# Patient Record
Sex: Female | Born: 1955 | Race: Black or African American | Hispanic: No | Marital: Single | Smoking: Never smoker
Health system: Southern US, Community
[De-identification: ages and names within clinical notes are randomized; demographics above are authoritative.]

## PROBLEM LIST (undated history)

## (undated) DIAGNOSIS — I10 Essential (primary) hypertension: Secondary | ICD-10-CM

## (undated) DIAGNOSIS — Z9889 Other specified postprocedural states: Secondary | ICD-10-CM

## (undated) DIAGNOSIS — T7840XA Allergy, unspecified, initial encounter: Secondary | ICD-10-CM

## (undated) DIAGNOSIS — E119 Type 2 diabetes mellitus without complications: Secondary | ICD-10-CM

## (undated) DIAGNOSIS — R112 Nausea with vomiting, unspecified: Secondary | ICD-10-CM

## (undated) HISTORY — DX: Nausea with vomiting, unspecified: Z98.890

## (undated) HISTORY — DX: Allergy, unspecified, initial encounter: T78.40XA

## (undated) HISTORY — PX: BREAST EXCISIONAL BIOPSY: SUR124

## (undated) HISTORY — PX: ROTATOR CUFF REPAIR: SHX139

## (undated) HISTORY — DX: Nausea with vomiting, unspecified: R11.2

## (undated) HISTORY — PX: ABDOMINAL HYSTERECTOMY: SHX81

## (undated) HISTORY — PX: OTHER SURGICAL HISTORY: SHX169

## (undated) HISTORY — DX: Other specified postprocedural states: Z98.890

## (undated) HISTORY — PX: ECTOPIC PREGNANCY SURGERY: SHX613

---

## 1997-12-16 ENCOUNTER — Emergency Department (HOSPITAL_COMMUNITY): Admission: EM | Admit: 1997-12-16 | Discharge: 1997-12-16 | Payer: Self-pay | Admitting: Emergency Medicine

## 1998-03-08 HISTORY — PX: COLONOSCOPY: SHX174

## 1998-05-09 ENCOUNTER — Other Ambulatory Visit: Admission: RE | Admit: 1998-05-09 | Discharge: 1998-05-09 | Payer: Self-pay | Admitting: Obstetrics and Gynecology

## 2002-05-09 ENCOUNTER — Other Ambulatory Visit: Admission: RE | Admit: 2002-05-09 | Discharge: 2002-05-09 | Payer: Self-pay | Admitting: Gynecology

## 2002-06-20 ENCOUNTER — Encounter: Admission: RE | Admit: 2002-06-20 | Discharge: 2002-06-20 | Payer: Self-pay

## 2002-06-21 ENCOUNTER — Emergency Department (HOSPITAL_COMMUNITY): Admission: EM | Admit: 2002-06-21 | Discharge: 2002-06-21 | Payer: Self-pay | Admitting: Emergency Medicine

## 2002-07-11 ENCOUNTER — Encounter: Payer: Self-pay | Admitting: Specialist

## 2002-07-16 ENCOUNTER — Encounter: Payer: Self-pay | Admitting: Specialist

## 2002-07-17 ENCOUNTER — Inpatient Hospital Stay (HOSPITAL_COMMUNITY): Admission: AD | Admit: 2002-07-17 | Discharge: 2002-07-18 | Payer: Self-pay | Admitting: Specialist

## 2003-07-02 ENCOUNTER — Ambulatory Visit (HOSPITAL_BASED_OUTPATIENT_CLINIC_OR_DEPARTMENT_OTHER): Admission: RE | Admit: 2003-07-02 | Discharge: 2003-07-02 | Payer: Self-pay | Admitting: Orthopedic Surgery

## 2003-11-26 ENCOUNTER — Encounter: Admission: RE | Admit: 2003-11-26 | Discharge: 2003-11-26 | Payer: Self-pay | Admitting: Specialist

## 2005-03-23 ENCOUNTER — Other Ambulatory Visit: Admission: RE | Admit: 2005-03-23 | Discharge: 2005-03-23 | Payer: Self-pay | Admitting: Obstetrics and Gynecology

## 2008-10-09 ENCOUNTER — Emergency Department (HOSPITAL_COMMUNITY): Admission: EM | Admit: 2008-10-09 | Discharge: 2008-10-09 | Payer: Self-pay | Admitting: Emergency Medicine

## 2008-10-13 ENCOUNTER — Emergency Department (HOSPITAL_COMMUNITY): Admission: EM | Admit: 2008-10-13 | Discharge: 2008-10-13 | Payer: Self-pay | Admitting: Emergency Medicine

## 2010-03-28 ENCOUNTER — Encounter: Payer: Self-pay | Admitting: Internal Medicine

## 2010-07-24 NOTE — Op Note (Signed)
NAME:  PANSIE, GUGGISBERG                       ACCOUNT NO.:  [id number]   MEDICAL RECORD NO.:  [id number]                   PATIENT TYPE:  INP   LOCATION:  5008                                 FACILITY:  [facility]   PHYSICIAN:  Kerrin Champagne, M.D.                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   POSTOPERATIVE DIAGNOSES:  1. 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  2. Right-sided spondylosis changes, C6-7.   PROCEDURES:  1. Anterior cervical diskectomy and fusion, C5-6, C6-7, utilizing the     operating room microscope.  2. Right iliac crest bone graft harvest for purposes of fusion.  3. Internal fixation, C5-6 and C6-7, using a 41 mm DePuy locking plate with     14 mm screws.  A single revision screw was used on the right side at the     C7 level.   SURGEON:  Kerrin Champagne, M.D.   ASSISTANT:  Wende Neighbors, P.A.   ANESTHESIA:  GOT, Bedelia Person, M.D.   ESTIMATED BLOOD LOSS:  150 mL.   DRAINS:  TLS drain left neck, Foley to straight drain.   BRIEF CLINICAL HISTORY:  The patient is a 55 year old female with a history  of neck pain over a four-week period, severe pain with radiation to the  right arm in a C6-C7 distribution.  The patient has undergone attempts at  conservative management, including the use of steroid medications, anti-  inflammatories, muscle relaxants, and narcotics.  She is having severe night  pain and difficulty with sleeping, radiation to the right arm, positive  abduction sign, pain only improved with adduction of the right arm away from  her side.  Clinically shows signs of weakness in a C7 distribution and C6  distribution.  MRI study consistent with a large disk protrusion in the  right-sided C5-6  affecting the right-sided thecal sac, causing cord shift,  and affecting the C6 and C7 nerve roots.  A small amount of disk protrusion  noted at C6-7 on the right side with compression of the C7 nerve root to a  mild degree.   INTRAOPERATIVE FINDINGS:  Extruded disk material, right side, causing C6, C7  nerve root compression with compression of the right-sided thecal sac at the  C6-7 level.  Right C5-6 spondylosis change involving the uncovertebral  joint.   DESCRIPTION OF PROCEDURE:  After adequate general anesthesia, patient in  beach chair position, Foley catheter placed, bump on the right buttock, neck  in slight extension with transverse roll at the level of the shoulders.  Five pounds of cervical Holter traction with use of the [place] horseshoe.  Standard  preoperative antibiotics, standard prep with Duraprep solution of  the anterior left neck, right iliac crest, skin over the right lower abdomen  placed under some tension using tape in order to be able to expose the right  iliac crest.  Draped in the usual manner, iodine Vi-Drape over the right  iliac crest and left neck.  The incision, left neck, in line with skin  creases, at the expected C6 level through the skin and subcutaneous layers  at midline, incision approximately 4 cm in length, carried down to the level  of the platysma using a 10 blade scalpel after infiltration with Marcaine  0.5% with 1:200,000 epinephrine.  The platysmal layer then incised in line  with the skin incision and then the subcutaneous layers then spread.  Small  veins were encountered.  These were cauterized as well as suture ligated  over the anterior aspect of the cervical region in the layer just deep to  the platysmal layer.  These were well-controlled.  Blunt dissection then  used to dissect between the planes of fascia between the trachea and  esophagus medially and the carotid sheath laterally to the anterior aspect  of the cervical spine and  prevertebral fascial layer.  Hand-held Clowards  then used to retract trachea and esophagus and the prevertebral fascia then  cauterized along the medial border of the longus colli muscle and then  teased across the midline using a Barista.  A spinal needle with  the sheaths intact, allowing only a centimeter of the needle to protrude was  then inserted at the expected C5-6, C6-7 levels.  Intraoperative lateral  radiograph was not performed; in fact, C-arm fluoroscopy was used to  ascertain the correct position and alignment of these pins, found to be  present at both C5-6 and C6-7.  Permanent C-arm images used for permanent  documentation here.  The medial border of the longus colli muscle then  developed using Key elevator as well as electrocautery.  The Caspar self-  retaining retractors were then inserted with foot of the retractor beneath  the border of the longus colli muscle bilaterally, exposing first at C6-7  level.  Note that prior to the insertion of this self-retaining retractor,  the spinal needles were removed and a small portion of the anterior annulus  was excised using a 15 blade scalpel with hand-held retraction to protect  the soft tissue structures medial and lateral.  With the Caspar retractors  then placed, first at the C6-7 level screw posts were then inserted into the  anterior aspects of the vertebral bodies at both C6 and C7 following  debridement of soft tissue along the anterior aspect of the C7 and the C6  vertebral bodies.  Osteophytes at the C6-7 level disk space were removed  using high-speed bur and Beyer rongeur.  The main portion of the annulus  anteriorly resected using a 15 blade scalpel under loupe magnification and  then curettage performed of the anterior two-thirds of the disk as well as  pituitary rongeurs used to remove the disk.  The operating room microscope  then draped, brought into the field.  Under direct visualization the posterior  disk excised at the C6-7 level.  Posterior lip osteophytes excised  over the posterior superior aspect of C7.  An uncovertebral foraminotomy  performed over the right side, excising the vertebral spurs, decompressing  the right C7 nerve root.  The central portion of the disk was also excised  posteriorly and the disk excised  back to the posterior longitudinal  ligament.  The height of the intervertebral disk space was measured using a  sounder and sounded out at 7 mm, the depth measuring approximately 16 mm.  A  depth of 14 mm for the graft and a height of 7 mm was chosen.  An incision  made over the right iliac crest approximately two inches back from the  anterior superior iliac spine, incision through skin and subcutaneous  layers.  A small sensory branch of the lateral cutaneous branch of the  femoral nerve was identified and this was preserved, protected throughout  the case.  The self-retaining retractors were inserted. Incision carried  down to the iliac crest and then Cobbs used to elevate the periosteum off of  the superior medial aspect of the iliac crest as well as laterally.  Hand-  held Clowards then placed, and the dual oscillating saw used to incise the  crest to a level of about 15-16 mm.  A quarter-inch curved osteotome used to  cut the base of this tricortical iliac crest bone graft piece.  The piece  was then carefully made with dimensions of the intervertebral disk space,  depth of 14 mm, a height of 7 mm.  The graft was easily placed within the  intervertebral disk space, and it was keyed anteriorly.  Traction then  released over the screw posts and reinserted at the C6-7 level using the  distraction system provided.  Bone wax was applied to the bleeding screw  post holes, that following removal of the screw posts.  The self-retaining  retractor, Caspar retractor, was then removed and reinserted at the C5-6  level.  Care taken to protect the esophagus and trachea  throughout this  procedure.  Exposure obtained at the C5-6 level, similarly the C6-7.  Osteophytes over the anterior aspect of C5-6 were resected.  Soft tissue  over the anterior lower half of the C5 and upper part of C6 was cauterized  down to bone.  A high-speed bur used to carefully smooth the anterior  osteophyte present and a 15 blade scalpel used to incise further the annular  material on the anterior aspect of the disk at C5-6.  Screw posts inserted  at the C5 level parallel to that of 6 and distraction obtained.  Disk  curetted and the end plates curetted of the cartilaginous material at C5-6,  and this was similarly done at C6-7 previous.  With curettage in the disk  space performed down to subchondral bone, the posterior aspect of the disk  was excised back to posterior longitudinal ligament.  Posterior longitudinal  ligament was then resected along the right side using a 1 mm Kerrison as  well as a titanium nerve hook.  Beneath the posterior longitudinal ligament was found to be a very large disk protrusion compressing the right-sided  thecal sac and the right C6 and C7 nerve roots.  This was resected without  difficulty using titanium nerve hook and micropituitary.  A 1 mm Kerrison  then used to perform a foraminotomy on the right side and then a 2 mm  Kerrison.  The C6 nerve root showed normal appearance, some mild swelling  and erythema associated with the area of compression due to the disk  rupture, but overall the thecal sac and nerve roots appeared to be intact  throughout.  Irrigation was then performed, curettage performed of the end  plates.  The height of the intervertebral disk space measured at 7 mm using  a sounder, depth of 16 mm again.  Additional iliac crest was then harvested  from the right iliac crest using the dual oscillating saw, 7 mm width.  An  additional graft obtained, tapered to the dimensions of the intervertebral  disk space, height of 7 mm, depth  13-14 mm.  This graft then inserted,  impacted into place.  The screw posts then removed from the C5 and C6 level  and bone wax applied to the bleeding screw post holes.  Length for the  expected plate was then measured using a cottonoid string coated with bone  wax.  Applied to the central portion of the body of C5 to C7, measuring  approximately 35 mm in length, which equalled that of a 41 mm plate between  the upper and lower holes.  The plate was pre-contoured.  The anterior  aspect of the cervical spine carefully debrided of any osteophytes to allow  for the plate to be able to anneal against for good fixation purposes.  The  plate then placed against the anterior aspect of the cervical spine in the  midline and slightly tapered on the left side and then pinned into place  with pins at the C5 level and C7 level.  Cervical traction was released.  Screws were then first placed at the C6 level, at very central levels by  performing drill holes for 14 mm screws and then placing the 14 mm screws  appropriately.  The pin then removed at the C5 level superiorly and then  drill holes performed both right and left.  These were then replaced with  the drill with the 14 mm screws at this level.  The pin was then removed at  the C7 level and then drill holes placed and 14 mm screws placed.  A  revision screw was necessary on the right side of C7, as adequate purchase  was not provided with the usual screw at the C7 right side.  This probably  is secondary the hole for the previous screw post.  Following this,  irrigation was performed and no active bleeding was felt to be evidenced.  The esophagus was examined and demonstrated no abnormalities.  The locks for  the locking screws were then carefully turned 180-270 degrees each, locking  the screws to the plate.  Following irrigation then, a 10 mm drain was  placed in the depths of the incision over the left side of the plate.  No  active bleeding  evident associated with placement of that drain.  The platysmal layer was then reapproximated with interrupted 3-0 Vicryl suture  and the deep subcu layers with interrupted 3-0 Vicryl suture and the skin  approximated with a running subcu stitch of 4-0 Vicryl.  Tincture of Benzoin  and Steri-Strips applied.  The 10 mm drain sewn in place with 4-0 nylon  suture.  The right iliac crest bone graft harvest site carefully coated with  bone wax for hemostasis of the cancellous bone surfaces, Gelfoam applied.  Irrigation performed.  Additional Gelfoam placed.  No active bleeding  evident.  The fascial layers then approximated over the iliac crest using #1  Vicryl sutures.  Deep subcu layers approximated with interrupted #1 and 0  Vicryl sutures, more superficial layers with interrupted 2-0 Vicryl suture,  and the skin closed with a running subcu stitch of 4-0 Vicryl.  Tincture of  Benzoin and Steri-Strips applied, 4 x 4's affixed to the skin with Hypafix  tape over the iliac crest.  A single 4 x 4 placed over the anterior neck and  the Hypafix tape used to affix it in place.  A Philadelphia collar was  applied.  Intraoperative lateral radiograph obtained prior to closure of the  wounds using the C-arm fluoroscopy demonstrated plates and screws in good  position and alignment, back in good position and alignment, without  evidence of retropulsion.  With application of the Philadelphia collar, all  instrument and sponge counts were correct.  The patient was reactivated,  extubated, and returned to the recovery room in satisfactory condition.                                                Kerrin Champagne, M.D.    JEN/MEDQ  D:  07/17/2002  T:  07/19/2002  Job:  [id number]

## 2010-07-24 NOTE — Op Note (Signed)
NAME:  Tamara Klein, Tamara Klein NO.:  [id number]   MEDICAL RECORD NO.:  [id number]                   PATIENT TYPE:  AMB   LOCATION:  DSC                                  FACILITY:  [facility]   PHYSICIAN:  Leonides Grills, M.D.                  DATE OF BIRTH:  10-Apr-1955   DATE OF PROCEDURE:  07/02/2003  DATE OF DISCHARGE:                                 OPERATIVE REPORT   ADDENDUM:  Add the following to the dictation:   Right Achilles tendon rupture in preoperative diagnosis.   Right primary Achilles tendon repair in procedure performed.                                               Leonides Grills, M.D.    PB/MEDQ  D:  07/02/2003  T:  07/02/2003  Job:  [id number]

## 2010-07-24 NOTE — Op Note (Signed)
NAME:  Tamara Klein, Tamara Klein                         ACCOUNT NO.:  [id number]   MEDICAL RECORD NO.:  [id number]                   PATIENT TYPE:  AMB   LOCATION:  DSC                                  FACILITY:  [facility]   PHYSICIAN:  Leonides Grills, M.D.                  DATE OF BIRTH:  [birth year]   DATE OF PROCEDURE:  07/02/2003  DATE OF DISCHARGE:                                 OPERATIVE REPORT   PREOPERATIVE DIAGNOSIS:  1. Right Hanglund's deformity.  2. Right calcific Achilles tendonitis.  3. Right tight gastrocnemius.   POSTOPERATIVE DIAGNOSIS:  1. Right Hanglund's deformity.  2. Right calcific Achilles tendonitis.  3. Right tight gastrocnemius.   OPERATION:  1. Right Hanglund's deformity excision.  2. Right excision calcification Achilles tendon.  3. Right gastrocnemius release.  4. Stress x-rays right ankle.   ANESTHESIA:  General endotracheal tube with popliteal block.   SURGEON:  Leonides Grills, M.D.   ASSISTANT:  Lianne Cure, P.A.-C.   ESTIMATED BLOOD LOSS:  Minimal.   TOURNIQUET TIME:  Approximately one hour.   COMPLICATIONS:  None.   DISPOSITION:  Stable to the PR.   INDICATIONS FOR PROCEDURE:  This is a 55 year old female with persistent  long-standing posterior heel pain interfering with her life.  She was  consented for the above procedure.  All risks which included infection,  neurovascular injury, Achilles tendon rupture, persistent pain, worsening  pain, recalcification of the Achilles tendon were all explained, questions  were encouraged and answered.   OPERATION:  The patient was brought to the operating room and placed in  supine position, initially, after adequate general endotracheal anesthesia  was administered as well as Ancef 1 gram IV piggyback.  I then placed her in  a prone position, all bony prominences and chest were well padded.  The  right lower extremity was then prepped and draped in a sterile manner over a  proximally placed thigh  tourniquet.  We started the procedure with a  longitudinal incision over the medial gastrocnemius muscle tendinous  junction.  Dissection was carried down through the skin, hemostasis was  obtained.  The fascia was opened in line with the incision.  The conjoined  region was then developed between the gastroc and soleus muscles.  The soft  tissue was then elevated off the posterior aspect of the gastrocnemius.  The  gastrocnemius was then released with the Mayo scissors.  This had an  excellent release of the tight gastroc.  The wound was copiously irrigated  with normal saline.  The subcu was closed with 3-0 Vicryl and the skin was  closed with 4-0 Monocryl subcuticular stitch.  Steri-Strips were applied.  We then gravity exsanguinated the right lower extremity and the tourniquet  was elevated to 290 mmHg.  A longitudinal incision on either side of the  Achilles tendon was made, dissection was carried down to bone.  Soft tissue  was  elevated off the calcaneal tuber respectively.  Under C-arm guidance,  stress x-rays were able to ascertain exactly where the calcification was and  what level we needed to osteotomize.  We then performed the Haglund's  excision with the sagittal saw.  Once this was done, we then removed the  calcification with the Achilles tendon with a 15 blade scalpel and also, at  times, with the saw.  The Achilles tendon was elevated directly off the  bone.  Once this was done, we then, because the Achilles tendon was  diseased, repaired it back down to the exposed calcaneal tuber using 5.0  corkscrew suture anchors that were absorbable with #2 FiberWire.  Once this  was repaired down, we did the remaining repair on either side with 2-0  FiberWire.  We then discontinued the tourniquet, hemostasis was obtained.  The subcu was closed with 3-0 Vicryl, skin was closed with 4-0 nylon.  Sterile dressing was applied.  A modified Jones dressing was applied with  the ankle in  gravity equinus.  The patient was stable to the PR.                                               Leonides Grills, M.D.    PB/MEDQ  D:  07/02/2003  T:  07/02/2003  Job:  [id number]

## 2010-07-27 ENCOUNTER — Other Ambulatory Visit (HOSPITAL_COMMUNITY): Payer: Self-pay | Admitting: Internal Medicine

## 2010-07-27 DIAGNOSIS — Z1231 Encounter for screening mammogram for malignant neoplasm of breast: Secondary | ICD-10-CM

## 2010-08-05 ENCOUNTER — Ambulatory Visit (HOSPITAL_COMMUNITY)
Admission: RE | Admit: 2010-08-05 | Discharge: 2010-08-05 | Disposition: A | Discharge status: Home / Self Care | Payer: Self-pay | Source: Ambulatory Visit | Attending: Internal Medicine | Admitting: Internal Medicine

## 2010-08-05 DIAGNOSIS — Z1231 Encounter for screening mammogram for malignant neoplasm of breast: Secondary | ICD-10-CM

## 2011-06-23 ENCOUNTER — Encounter (HOSPITAL_COMMUNITY): Payer: Self-pay | Admitting: Emergency Medicine

## 2011-06-23 ENCOUNTER — Emergency Department (HOSPITAL_COMMUNITY): Payer: Self-pay

## 2011-06-23 ENCOUNTER — Emergency Department (HOSPITAL_COMMUNITY)
Admission: EM | Admit: 2011-06-23 | Discharge: 2011-06-23 | Disposition: A | Discharge status: Home / Self Care | Payer: Self-pay | Attending: Emergency Medicine | Admitting: Emergency Medicine

## 2011-06-23 DIAGNOSIS — M25511 Pain in right shoulder: Secondary | ICD-10-CM

## 2011-06-23 DIAGNOSIS — M542 Cervicalgia: Secondary | ICD-10-CM | POA: Insufficient documentation

## 2011-06-23 DIAGNOSIS — R209 Unspecified disturbances of skin sensation: Secondary | ICD-10-CM | POA: Insufficient documentation

## 2011-06-23 DIAGNOSIS — Z9889 Other specified postprocedural states: Secondary | ICD-10-CM | POA: Insufficient documentation

## 2011-06-23 DIAGNOSIS — M25519 Pain in unspecified shoulder: Secondary | ICD-10-CM | POA: Insufficient documentation

## 2011-06-23 MED ORDER — METHOCARBAMOL 500 MG PO TABS: 1000.0000 mg | ORAL_TABLET | Freq: Three times a day (TID) | ORAL | Status: AC

## 2011-06-23 NOTE — ED Notes (Signed)
Pt states she has had numbness in her hands for a while but within the past week she has developed pain in her neck and tingling in her right arm  Pt states when the tingling starts the arm feels like it goes numb at the shoulder area  Pt states the pain wakes her up at night

## 2011-06-23 NOTE — Discharge Instructions (Signed)
Your xray showed some degenerative (arthritis) changes to your neck but did not show any acute, worrisome findings. This may represent a muscle spasm. Please follow up with [facility] for further evaluation and treatment - call to make an appointment this week. Return to the ER if you develop increased weakness, numbness, inability to move the arm, or any other worrisome symptoms.  Arthralgia Your caregiver has diagnosed you as suffering from an arthralgia. Arthralgia means there is pain in a joint. This can come from many reasons including:  Bruising the joint which causes soreness (inflammation) in the joint.   Wear and tear on the joints which occur as we grow older (osteoarthritis).   Overusing the joint.   Various forms of arthritis.   Infections of the joint.  Regardless of the cause of pain in your joint, most of these different pains respond to anti-inflammatory drugs and rest. The exception to this is when a joint is infected, and these cases are treated with antibiotics, if it is a bacterial infection. HOME CARE INSTRUCTIONS   Rest the injured area for as long as directed by your caregiver. Then slowly start using the joint as directed by your caregiver and as the pain allows. Crutches as directed may be useful if the ankles, knees or hips are involved. If the knee was splinted or casted, continue use and care as directed. If an stretchy or elastic wrapping bandage has been applied today, it should be removed and re-applied every 3 to 4 hours. It should not be applied tightly, but firmly enough to keep swelling down. Watch toes and feet for swelling, bluish discoloration, coldness, numbness or excessive pain. If any of these problems (symptoms) occur, remove the ace bandage and re-apply more loosely. If these symptoms persist, contact your caregiver or return to this location.   For the first 24 hours, keep the injured extremity elevated on pillows while lying down.   Apply ice  for 15 to 20 minutes to the sore joint every couple hours while awake for the first half day. Then 3 to 4 times per day for the first 48 hours. Put the ice in a plastic bag and place a towel between the bag of ice and your skin.   Wear any splinting, casting, elastic bandage applications, or slings as instructed.   Only take over-the-counter or prescription medicines for pain, discomfort, or fever as directed by your caregiver. Do not use aspirin immediately after the injury unless instructed by your physician. Aspirin can cause increased bleeding and bruising of the tissues.   If you were given crutches, continue to use them as instructed and do not resume weight bearing on the sore joint until instructed.  Persistent pain and inability to use the sore joint as directed for more than 2 to 3 days are warning signs indicating that you should see a caregiver for a follow-up visit as soon as possible. Initially, a hairline fracture (break in bone) may not be evident on X-rays. Persistent pain and swelling indicate that further evaluation, non-weight bearing or use of the joint (use of crutches or slings as instructed), or further X-rays are indicated. X-rays may sometimes not show a small fracture until a week or 10 days later. Make a follow-up appointment with your own caregiver or one to whom we have referred you. A radiologist (specialist in reading X-rays) may read your X-rays. Make sure you know how you are to obtain your X-ray results. Do not assume everything is normal if  you do not hear from [place]. SEEK MEDICAL CARE IF: Bruising, swelling, or pain increases. SEEK IMMEDIATE MEDICAL CARE IF:   Your fingers or toes are numb or blue.   The pain is not responding to medications and continues to stay the same or get worse.   The pain in your joint becomes severe.   You develop a fever over 102 F (38.9 C).   It becomes impossible to move or use the joint.  MAKE SURE YOU:   Understand these  instructions.   Will watch your condition.   Will get help right away if you are not doing well or get worse.  Document Released: 02/22/2005 Document Revised: 02/11/2011 Document Reviewed: 10/11/2007 [facility] Patient Information 2012 [place], [place].

## 2011-06-23 NOTE — ED Provider Notes (Signed)
History     CSN: [id number]  Arrival date & time 06/23/11  2001   First MD Initiated Contact with Patient 06/23/11 2038      Chief Complaint  Patient presents with  . Neck Pain    (Consider location/radiation/quality/duration/timing/severity/associated sxs/prior treatment) HPI Hx from pt. 56yo F presents with tingling to her right arm. She has had intermittent numbness to her bilateral hands but developed pain in her R shoulder and some tingling going into her upper arm over the past week. She has been seen for the numbness at her PCP at [facility] several months ago and was prescribed naproxen. Pt states the numbness extends to all of her fingers. Denies decreased grip strength, difficulty moving extremities. No known exacerbating factors. The pain is not worse at a certain time of the day. Of note, she does have a hx of cervical spine fusion, which was performed over 10 years ago by a Writer. She additionally has a history of rotator cuff repair to the right shoulder approximately 5 years ago. No trauma. No change in activity.  History reviewed. No pertinent past medical history.  Past Surgical History  Procedure Date  . Breast lumpectomy   . Ectopic pregnancy surgery   . Abdominal hysterectomy   . Left arthrocsopic knee surgery   . Left bunionectomy   . Bone spur removal bilateral heels   . Rotator cuff repair     Family History  Problem Relation Age of Onset  . Cancer Mother   . Hypertension Other   . Coronary artery disease Other   . Diabetes Other     History  Substance Use Topics  . Smoking status: Never Smoker   . Smokeless tobacco: Not on file  . Alcohol Use: Yes     rare    OB History    Grav Para Term Preterm Abortions TAB SAB Ect Mult Living                  Review of Systems  Constitutional: Negative.   HENT: Negative for neck pain and neck stiffness.   Eyes: Negative for visual disturbance.  Respiratory: Negative for shortness of breath.    Cardiovascular: Negative for chest pain and palpitations.  Gastrointestinal: Negative for nausea, vomiting and abdominal pain.  Musculoskeletal: Positive for myalgias.  Skin: Negative for color change and rash.  Neurological: Negative for dizziness, syncope, speech difficulty, weakness, light-headedness and headaches.    Allergies  Review of patient's allergies indicates no known allergies.  Home Medications   Current Outpatient Rx  Name Route Sig Dispense Refill  . NAPROXEN 500 MG PO TABS Oral Take 500 mg by mouth 2 (two) times daily with a meal.      BP 130/75  Pulse 83  Temp(Src) 97.5 F (36.4 C) (Oral)  Resp 20  Wt 212 lb (96.163 kg)  SpO2 100%  Physical Exam  Nursing note and vitals reviewed. Constitutional: She is oriented to person, place, and time. She appears well-developed and well-nourished. No distress.  HENT:  Head: Normocephalic and atraumatic.  Right Ear: External ear normal.  Left Ear: External ear normal.  Mouth/Throat: Oropharynx is clear and moist. No oropharyngeal exudate.  Neck: Normal range of motion. Neck supple.  Cardiovascular: Normal rate, regular rhythm and normal heart sounds.   Pulmonary/Chest: Effort normal and breath sounds normal.  Musculoskeletal:       Spine: No palpable stepoff, crepitus, or gross deformity appreciated. No midline tenderness. No appreciable spasm of paravertebral muscles.  Full range of motion in the neck.  Palpable muscle spasm to bilateral trapezii. Full range of motion in shoulders, elbows, and wrists. Negative Tinel's and Phalen's testing.  Lymphadenopathy:    She has no cervical adenopathy.  Neurological: She is alert and oriented to person, place, and time. No cranial nerve deficit. She exhibits normal muscle tone. Coordination normal.       Strength 5 out of 5 on testing of bilateral upper extremities at shoulders, elbows, wrists and fingers. Sensory is grossly intact to light touch in the radial, median, and  ulnar distributions bilaterally.  Skin: Skin is warm and dry. No rash noted. She is not diaphoretic.  Psychiatric: She has a normal mood and affect.    ED Course  Procedures (including critical care time)  Labs Reviewed - No data to display Dg Cervical Spine Complete  06/23/2011  *RADIOLOGY REPORT*  Clinical Data: Neck pain.  Tingling bilateral arms.  CERVICAL SPINE - COMPLETE 4+ VIEW  Comparison: None.  Findings: There are postsurgical changes of anterior cervical discectomy and fusion spanning C5, C6, and C7.  There appears to be complete bony fusion at these levels.  No hardware complication is identified.  Cervical spine vertebral bodies are aligned from the skull base to the cervicothoracic junction.  There is a large anterior osteophyte extending anteriorly from C4, and projecting anterior to the hardware C5.  There are smaller anterior osteophytes at C2-3.  There are degenerative changes at C1-C2 articulation.  The C2-3, C3-4, and C4-5 disc spaces appear maintained.  On the oblique views, bony neural foraminal narrowing is noted on the left at the C5-6.  Bony neural foraminal narrowing on the right is noted at C2-3.  The lateral masses of C1-C2 are aligned.  On the open mouth odontoid view, linear lucency projects over the base of the dens. This lucency is favored to be due to superimposition of degenerative change with large osteophyte along the anterior C1 ring, which has a space between the anterior ring and osteophyte.  The prevertebral soft tissue contours within normal limits.  IMPRESSION:  1.  Prior anterior cervical discectomy and fusion spanning C5-3 C7 without complicating features. 2.  Prominent degenerative changes at the anterior articulation of the C1 ring and the C2 vertebral body.  This degenerative change likely results in the lucency seen in the base of the dens on the odontoid view.  If there is any clinical concern for fracture, CT of the cervical spine could be performed. 3.   Prominent anterior osteophyte formation at C4-C5. 4.  Bony neural foraminal narrowing on the left at C5-6 and bony neural foraminal narrowing on the right at C2-C3.  Original Report Authenticated By: Britta Mccreedy, M.D.     1. Right shoulder pain       MDM  56 year old female with past medical history of cervical spine fusion presents with sensation of numbness and tingling to the right arm. On exam, she does not have a dermatomal distribution of her symptoms. Her strength is intact and equal bilaterally. She has no neuro deficits seen on exam to suggest more insidious neurologic causes. A cervical spine clinical was obtained, which I personally reviewed and reviewed with the patient. It does show degenerative changes, but these changes do not correspond in a dermatomal pattern with the patient's symptoms.   Of note, the patient was noted to have quite a bit of muscle spasm to her trapezii bilaterally. Will give Robaxin as I question whether this may be aggravating  her symptoms. Patient encouraged to continue Naprosyn as prescribed. As she does have a history of cervical spinal fusion, I strongly suggested that the patient plan to followup with her surgeon to determine if she needs further evaluation or imaging of the spine. Return precautions discussed. Patient verbalized understanding and was agreeable with this plan.        Grant Fontana, [place] 06/25/11 0010

## 2011-06-25 NOTE — ED Provider Notes (Signed)
Medical screening examination/treatment/procedure(s) were performed by non-physician practitioner and as supervising physician I was immediately available for consultation/collaboration. Devoria Albe, MD, Armando Gang   Ward Givens, MD 06/25/11 [phone]

## 2012-07-07 ENCOUNTER — Encounter (HOSPITAL_COMMUNITY): Payer: Self-pay | Admitting: Emergency Medicine

## 2012-07-07 ENCOUNTER — Emergency Department (HOSPITAL_COMMUNITY): Payer: Self-pay

## 2012-07-07 ENCOUNTER — Emergency Department (HOSPITAL_COMMUNITY)
Admission: EM | Admit: 2012-07-07 | Discharge: 2012-07-07 | Disposition: A | Discharge status: Home / Self Care | Payer: Self-pay | Attending: Emergency Medicine | Admitting: Emergency Medicine

## 2012-07-07 DIAGNOSIS — Z9889 Other specified postprocedural states: Secondary | ICD-10-CM | POA: Insufficient documentation

## 2012-07-07 DIAGNOSIS — Z79899 Other long term (current) drug therapy: Secondary | ICD-10-CM | POA: Insufficient documentation

## 2012-07-07 DIAGNOSIS — M25512 Pain in left shoulder: Secondary | ICD-10-CM

## 2012-07-07 DIAGNOSIS — R209 Unspecified disturbances of skin sensation: Secondary | ICD-10-CM | POA: Insufficient documentation

## 2012-07-07 DIAGNOSIS — M25519 Pain in unspecified shoulder: Secondary | ICD-10-CM | POA: Insufficient documentation

## 2012-07-07 NOTE — ED Notes (Signed)
Pt c/o L shoulder pain from near neck to wrist, worse with position change. No neuro deficits. Denies inury

## 2012-07-07 NOTE — ED Provider Notes (Signed)
History    This chart was scribed for non-physician practitioner Francee Piccolo working with Lyanne Co, MD by Quintella Reichert, ED Scribe. This patient was seen in room WTR9/WTR9 and the patient's care was started at 1:29 PM .   CSN: [id number]  Arrival date & time 07/07/12  2055      Chief Complaint  Patient presents with  . Shoulder Pain     The history is provided by the patient. No language interpreter was used.   Tamara Klein is a 57 y.o. female who presents to the Emergency Department complaining of constant, moderate, left-sided shoulder pain that began 2 weeks ago but that has become more severe in the past 3 days.  Pt describes pain as throbbing, aching, and occasionally sharp.  She also describes her shoulder as stiff in a way similar to past experience with a rotator cuff injury.  Pt states pain is exacerbated by lying down, and alleviated somewhat by Tylenol.  Pt also reports hearing popping sounds with shoulder joint movement, and reports constant numbness in left arm.   She denies recent h/o injury.  Pt denies weakness, dizziness, CP, SOB, abdominal pain, fever, chills, nausea, emesis, diarrhea, urinary symptoms, headache, or any other associated symptoms.   History reviewed. No pertinent past medical history.  Past Surgical History  Procedure Laterality Date  . Breast lumpectomy    . Ectopic pregnancy surgery    . Abdominal hysterectomy    . Left arthrocsopic knee surgery    . Left bunionectomy    . Bone spur removal bilateral heels    . Rotator cuff repair      Family History  Problem Relation Age of Onset  . Cancer Mother   . Hypertension Other   . Coronary artery disease Other   . Diabetes Other     History  Substance Use Topics  . Smoking status: Never Smoker   . Smokeless tobacco: Not on file  . Alcohol Use: Yes     Comment: rare    OB History   Grav Para Term Preterm Abortions TAB SAB Ect Mult Living                  Review  of Systems  Constitutional: Negative for fever and chills.  HENT: Negative for sore throat and neck pain.   Respiratory: Negative for shortness of breath.   Cardiovascular: Negative for chest pain.  Gastrointestinal: Negative for nausea, vomiting, abdominal pain and diarrhea.  Genitourinary: Negative for dysuria and difficulty urinating.  Musculoskeletal: Positive for arthralgias (Left shoulder).  Neurological: Positive for numbness (Left arm). Negative for dizziness, weakness and headaches.  All other systems reviewed and are negative.     Allergies  Review of patient's allergies indicates no known allergies.  Home Medications   Current Outpatient Rx  Name  Route  Sig  Dispense  Refill  . b complex vitamins tablet   Oral   Take 1 tablet by mouth every morning.         . Multiple Vitamin (MULTIVITAMIN WITH MINERALS) TABS   Oral   Take 1 tablet by mouth every morning.         . naproxen (NAPROSYN) 500 MG tablet   Oral   Take 500 mg by mouth 2 (two) times daily as needed (for pain).            BP 124/70  Pulse 78  Temp(Src) 98 F (36.7 C) (Oral)  Resp 20  Ht 5\' 3"  (  1.6 m)  Wt 195 lb (88.451 kg)  BMI 34.55 kg/m2  SpO2 98%  Physical Exam  Nursing note and vitals reviewed. Constitutional: She is oriented to person, place, and time. She appears well-developed and well-nourished. No distress.  HENT:  Head: Normocephalic and atraumatic.  Eyes: EOM are normal.  Neck: Neck supple. No tracheal deviation present.  Cardiovascular: Normal rate.   Pulses:      Radial pulses are 2+ on the right side, and 2+ on the left side.  Pulmonary/Chest: Effort normal. No respiratory distress.  Musculoskeletal: Normal range of motion.       Right shoulder: Normal.       Left shoulder: She exhibits tenderness. She exhibits normal range of motion, no bony tenderness, no swelling, no effusion, no crepitus, no deformity, no laceration, no pain, no spasm, normal pulse and normal  strength.  Neurological: She is alert and oriented to person, place, and time.  Skin: Skin is warm and dry.  Psychiatric: She has a normal mood and affect. Her behavior is normal.     ED Course  Procedures (including critical care time)  DIAGNOSTIC STUDIES: Oxygen Saturation is 98% on room air, normal by my interpretation.    COORDINATION OF CARE: 10:09 PM-Explained that x-ray ruled out injury as source of symptoms, and that pain is likely due to arthritis.  Discussed treatment plan which includes f/u with PCP and pain medication (aleve or ibuprofen) with pt at bedside and pt agreed to plan.      Labs Reviewed - No data to display Dg Shoulder Left  07/07/2012  *RADIOLOGY REPORT*  Clinical Data: Worsening shoulder pain, no known injury  LEFT SHOULDER - 2+ VIEW  Comparison: None.  Findings: No acute fracture or malalignment.  The humeral head is located with respect to the glenoid on the scapular Y view.  There is moderate osteoarthritis of the acromioclavicular joint and mild degenerative change at the glenohumeral joint.  The bones are mildly osteopenic.  Anterior cervical fusion hardware spanning C5- C7 is incompletely imaged.  No lytic or blastic osseous lesion. The visualized thorax is unremarkable.  IMPRESSION:  1.  No acute fracture, malalignment or aggressive osseous lesion 2.  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   Original Report Authenticated By: Malachy Moan, M.D.      1. Shoulder pain, acute, left       MDM  PE shows no instability, tenderness, or deformity of acromioclavicular and sternoclavicular joints, the cervical spine, glenohumeral joint, coracoid process, acromion, or scapula. Good shoulder strength during empty can test. Good ROM during scratch test. No signs of impingement on Neers test. No shoulder instability during Apprehension test. Imaging showed osteoarthritis. Based on presentation and imaging pain is believed to  be d/t arthritis. Patient was advised to use NSAIDs for pain relief and follow up with PCP. Patient agreeable to plan. Patient is stable at time of discharge        I personally performed the services described in this documentation, which was scribed in my presence. The recorded information has been reviewed and is accurate.     Jeannetta Ellis, PA-C 07/08/12 1331

## 2012-07-10 NOTE — ED Provider Notes (Signed)
Medical screening examination/treatment/procedure(s) were performed by non-physician practitioner and as supervising physician I was immediately available for consultation/collaboration.  Lyanne Co, MD 07/10/12 [phone]

## 2013-11-25 ENCOUNTER — Emergency Department (HOSPITAL_BASED_OUTPATIENT_CLINIC_OR_DEPARTMENT_OTHER)
Admission: EM | Admit: 2013-11-25 | Discharge: 2013-11-25 | Disposition: A | Discharge status: Home / Self Care | Payer: Self-pay | Attending: Emergency Medicine | Admitting: Emergency Medicine

## 2013-11-25 ENCOUNTER — Encounter (HOSPITAL_BASED_OUTPATIENT_CLINIC_OR_DEPARTMENT_OTHER): Payer: Self-pay | Admitting: Emergency Medicine

## 2013-11-25 DIAGNOSIS — J3489 Other specified disorders of nose and nasal sinuses: Secondary | ICD-10-CM | POA: Insufficient documentation

## 2013-11-25 DIAGNOSIS — R600 Localized edema: Secondary | ICD-10-CM

## 2013-11-25 DIAGNOSIS — R110 Nausea: Secondary | ICD-10-CM | POA: Insufficient documentation

## 2013-11-25 DIAGNOSIS — D649 Anemia, unspecified: Secondary | ICD-10-CM | POA: Insufficient documentation

## 2013-11-25 DIAGNOSIS — Z79899 Other long term (current) drug therapy: Secondary | ICD-10-CM | POA: Insufficient documentation

## 2013-11-25 DIAGNOSIS — R05 Cough: Secondary | ICD-10-CM | POA: Insufficient documentation

## 2013-11-25 DIAGNOSIS — R609 Edema, unspecified: Secondary | ICD-10-CM | POA: Insufficient documentation

## 2013-11-25 DIAGNOSIS — M7989 Other specified soft tissue disorders: Secondary | ICD-10-CM | POA: Insufficient documentation

## 2013-11-25 DIAGNOSIS — R059 Cough, unspecified: Secondary | ICD-10-CM | POA: Insufficient documentation

## 2013-11-25 LAB — CBC WITH DIFFERENTIAL/PLATELET
BASOS ABS: 0 10*3/uL (ref 0.0–0.1)
BASOS PCT: 0 % (ref 0–1)
EOS PCT: 5 % (ref 0–5)
Eosinophils Absolute: 0.5 10*3/uL (ref 0.0–0.7)
HEMATOCRIT: 34.8 % — AB (ref 36.0–46.0)
Hemoglobin: 11.6 g/dL — ABNORMAL LOW (ref 12.0–15.0)
LYMPHS PCT: 34 % (ref 12–46)
Lymphs Abs: 3.2 10*3/uL (ref 0.7–4.0)
MCH: 29.3 pg (ref 26.0–34.0)
MCHC: 33.3 g/dL (ref 30.0–36.0)
MCV: 87.9 fL (ref 78.0–100.0)
MONO ABS: 0.7 10*3/uL (ref 0.1–1.0)
Monocytes Relative: 8 % (ref 3–12)
Neutro Abs: 5 10*3/uL (ref 1.7–7.7)
Neutrophils Relative %: 53 % (ref 43–77)
PLATELETS: 264 10*3/uL (ref 150–400)
RBC: 3.96 MIL/uL (ref 3.87–5.11)
RDW: 12.5 % (ref 11.5–15.5)
WBC: 9.5 10*3/uL (ref 4.0–10.5)

## 2013-11-25 LAB — URINALYSIS, ROUTINE W REFLEX MICROSCOPIC
Bilirubin Urine: NEGATIVE
GLUCOSE, UA: NEGATIVE mg/dL
HGB URINE DIPSTICK: NEGATIVE
Ketones, ur: NEGATIVE mg/dL
Nitrite: NEGATIVE
PH: 6.5 (ref 5.0–8.0)
PROTEIN: NEGATIVE mg/dL
SPECIFIC GRAVITY, URINE: 1.014 (ref 1.005–1.030)
Urobilinogen, UA: 1 mg/dL (ref 0.0–1.0)

## 2013-11-25 LAB — COMPREHENSIVE METABOLIC PANEL
ALBUMIN: 3.8 g/dL (ref 3.5–5.2)
ALT: 16 U/L (ref 0–35)
AST: 24 U/L (ref 0–37)
Alkaline Phosphatase: 81 U/L (ref 39–117)
Anion gap: 13 (ref 5–15)
BUN: 18 mg/dL (ref 6–23)
CALCIUM: 9.5 mg/dL (ref 8.4–10.5)
CO2: 25 meq/L (ref 19–32)
CREATININE: 0.7 mg/dL (ref 0.50–1.10)
Chloride: 103 mEq/L (ref 96–112)
GFR calc Af Amer: >90 mL/min (ref >90)
Glucose, Bld: 95 mg/dL (ref 70–99)
Potassium: 3.6 mEq/L — ABNORMAL LOW (ref 3.7–5.3)
SODIUM: 141 meq/L (ref 137–147)
TOTAL PROTEIN: 7.8 g/dL (ref 6.0–8.3)
Total Bilirubin: 0.5 mg/dL (ref 0.3–1.2)

## 2013-11-25 LAB — PRO B NATRIURETIC PEPTIDE: PRO B NATRI PEPTIDE: 11.2 pg/mL (ref 0–125)

## 2013-11-25 MED ORDER — HYDROCHLOROTHIAZIDE 25 MG PO TABS: 25.0000 mg | ORAL_TABLET | Freq: Every day | ORAL | Status: DC

## 2013-11-25 MED ORDER — POTASSIUM CHLORIDE CRYS ER 20 MEQ PO TBCR
20.0000 meq | EXTENDED_RELEASE_TABLET | Freq: Once | ORAL | Status: AC
  Administered 2013-11-25: 20 meq via ORAL
  Filled 2013-11-25: qty 1

## 2013-11-25 NOTE — ED Provider Notes (Signed)
CSN: [id number]     Arrival date & time 11/25/13  2036 History  This chart was scribed for Rolan Bucco, MD by Tonye Royalty, ED Scribe. This patient was seen in room MH05/MH05 and the patient's care was started at 9:31 PM.    Chief Complaint  Patient presents with  . Leg Swelling   The history is provided by the patient. No language interpreter was used.   HPI Comments: Tamara Klein is a 58 y.o. female who presents to the Emergency Department complaining of swelling to bilateral legs with onset 2 weeks ago, worse today. She states the swelling is greater during the day after being on her feet but states they improve at night after rest. She reports cramping to her legs at night if she has to get up too many times to urinate. She reports taking fluid pills previously (HCTZ). She denies history of diabetes of hypertension. She states she has a cold and has a dry cough and rhinorrhea. She reports nausea after taking iron pills today for anemia. She denies chest pain, chest discomfort, dizziness, lightheadedness, vomiting, fevers, leg pain, or shortness of breath.  History reviewed. No pertinent past medical history. Past Surgical History  Procedure Laterality Date  . Breast lumpectomy    . Ectopic pregnancy surgery    . Abdominal hysterectomy    . Left arthrocsopic knee surgery    . Left bunionectomy    . Bone spur removal bilateral heels    . Rotator cuff repair     Family History  Problem Relation Age of Onset  . Cancer Mother   . Hypertension Other   . Coronary artery disease Other   . Diabetes Other    History  Substance Use Topics  . Smoking status: Never Smoker   . Smokeless tobacco: Not on file  . Alcohol Use: No   OB History   Grav Para Term Preterm Abortions TAB SAB Ect Mult Living                 Review of Systems  Constitutional: Negative for fever, chills, diaphoresis and fatigue.  HENT: Positive for rhinorrhea. Negative for congestion and sneezing.   Eyes:  Negative.   Respiratory: Positive for cough. Negative for chest tightness and shortness of breath.   Cardiovascular: Positive for leg swelling. Negative for chest pain.  Gastrointestinal: Positive for nausea. Negative for vomiting, abdominal pain, diarrhea and blood in stool.  Genitourinary: Negative for frequency, hematuria, flank pain and difficulty urinating.  Musculoskeletal: Negative for arthralgias and back pain.       Denies leg pain  Skin: Negative for rash.  Neurological: Negative for dizziness, speech difficulty, weakness, numbness and headaches.      Allergies  Review of patient's allergies indicates no known allergies.  Home Medications   Prior to Admission medications   Medication Sig Start Date End Date Taking? Authorizing Provider  b complex vitamins tablet Take 1 tablet by mouth every morning.    Historical Provider, MD  hydrochlorothiazide (HYDRODIURIL) 25 MG tablet Take 1 tablet (25 mg total) by mouth daily. 11/25/13   Rolan Bucco, MD  Multiple Vitamin (MULTIVITAMIN WITH MINERALS) TABS Take 1 tablet by mouth every morning.    Historical Provider, MD  naproxen (NAPROSYN) 500 MG tablet Take 500 mg by mouth 2 (two) times daily as needed (for pain).     Historical Provider, MD   BP 165/77  Pulse 60  Temp(Src) 98.6 F (37 C) (Oral)  Ht  (1.6  m)  Wt 210 lb (95.255 kg)  BMI 37.21 kg/m2  SpO2 100% Physical Exam  Nursing note and vitals reviewed. Constitutional: She is oriented to person, place, and time. She appears well-developed and well-nourished.  HENT:  Head: Normocephalic and atraumatic.  Eyes: Pupils are equal, round, and reactive to light.  Neck: Normal range of motion. Neck supple.  Cardiovascular: Normal rate, regular rhythm and normal heart sounds.   Pulmonary/Chest: Effort normal and breath sounds normal. No respiratory distress. She has no wheezes. She has no rales. She exhibits no tenderness.  Abdominal: Soft. Bowel sounds are normal. There is  no tenderness. There is no rebound and no guarding.  Musculoskeletal: Normal range of motion. She exhibits edema (bilat 2+ pitting edema). She exhibits no tenderness (calf).  no skin discoloration  Lymphadenopathy:    She has no cervical adenopathy.  Neurological: She is alert and oriented to person, place, and time.  Skin: Skin is warm and dry. No rash noted.  Psychiatric: She has a normal mood and affect.    ED Course  Procedures (including critical care time) Labs Review Results for orders placed during the hospital encounter of 11/25/13  CBC WITH DIFFERENTIAL      Result Value Ref Range   WBC 9.5  4.0 - 10.5 K/uL   RBC 3.96  3.87 - 5.11 MIL/uL   Hemoglobin 11.6 (*) 12.0 - 15.0 g/dL   HCT 16.1 (*) 09.6 - 04.5 %   MCV 87.9  78.0 - 100.0 fL   MCH 29.3  26.0 - 34.0 pg   MCHC 33.3  30.0 - 36.0 g/dL   RDW 40.9  81.1 - 91.4 %   Platelets 264  150 - 400 K/uL   Neutrophils Relative % 53  43 - 77 %   Neutro Abs 5.0  1.7 - 7.7 K/uL   Lymphocytes Relative 34  12 - 46 %   Lymphs Abs 3.2  0.7 - 4.0 K/uL   Monocytes Relative 8  3 - 12 %   Monocytes Absolute 0.7  0.1 - 1.0 K/uL   Eosinophils Relative 5  0 - 5 %   Eosinophils Absolute 0.5  0.0 - 0.7 K/uL   Basophils Relative 0  0 - 1 %   Basophils Absolute 0.0  0.0 - 0.1 K/uL  COMPREHENSIVE METABOLIC PANEL      Result Value Ref Range   Sodium 141  137 - 147 mEq/L   Potassium 3.6 (*) 3.7 - 5.3 mEq/L   Chloride 103  96 - 112 mEq/L   CO2 25  19 - 32 mEq/L   Glucose, Bld 95  70 - 99 mg/dL   BUN 18  6 - 23 mg/dL   Creatinine, Ser 7.82  0.50 - 1.10 mg/dL   Calcium 9.5  8.4 - 95.6 mg/dL   Total Protein 7.8  6.0 - 8.3 g/dL   Albumin 3.8  3.5 - 5.2 g/dL   AST 24  0 - 37 U/L   ALT 16  0 - 35 U/L   Alkaline Phosphatase 81  39 - 117 U/L   Total Bilirubin 0.5  0.3 - 1.2 mg/dL   GFR calc non Af Amer >90  >90 mL/min   GFR calc Af Amer >90  >90 mL/min   Anion gap 13  5 - 15  URINALYSIS, ROUTINE W REFLEX MICROSCOPIC      Result Value Ref  Range   Color, Urine YELLOW  YELLOW   APPearance CLEAR  CLEAR  Specific Gravity, Urine 1.014  1.005 - 1.030   pH 6.5  5.0 - 8.0   Glucose, UA NEGATIVE  NEGATIVE mg/dL   Hgb urine dipstick NEGATIVE  NEGATIVE   Bilirubin Urine NEGATIVE  NEGATIVE   Ketones, ur NEGATIVE  NEGATIVE mg/dL   Protein, ur NEGATIVE  NEGATIVE mg/dL   Urobilinogen, UA 1.0  0.0 - 1.0 mg/dL   Nitrite NEGATIVE  NEGATIVE   Leukocytes, UA TRACE (*) NEGATIVE  PRO B NATRIURETIC PEPTIDE      Result Value Ref Range   Pro B Natriuretic peptide (BNP) 11.2  0 - 125 pg/mL   No results found.   Imaging Review No results found.   EKG Interpretation None     DIAGNOSTIC STUDIES: Oxygen Saturation is 100% on room air, normal by my interpretation.    COORDINATION OF CARE: 9:37 PM Discussed treatment plan, including obtaining blood work results, with patient at beside, the patient agrees with the plan and has no further questions at this time.   Date: 11/25/2013  Rate: 52  Rhythm: normal sinus rhythmbradycardia  QRS Axis: normal  Intervals: normal  ST/T Wave abnormalities: normal  Conduction Disutrbances:none  Narrative Interpretation:   Old EKG Reviewed: none available    MDM   Final diagnoses:  Pedal edema   No signs of CHF.  Swelling bilateral and no associated pain, doubt DVT.  Will start on HCTZ for next few days.  F/u with her PMD.  I personally performed the services described in this documentation, which was scribed in my presence.  The recorded information has been reviewed and considered.    Rolan Bucco, MD 11/25/13 2258

## 2013-11-25 NOTE — Discharge Instructions (Signed)
Peripheral Edema °You have swelling in your legs (peripheral edema). This swelling is due to excess accumulation of salt and water in your body. Edema may be a sign of heart, kidney or liver disease, or a side effect of a medication. It may also be due to problems in the leg veins. Elevating your legs and using special support stockings may be very helpful, if the cause of the swelling is due to poor venous circulation. Avoid long periods of standing, whatever the cause. °Treatment of edema depends on identifying the cause. Chips, pretzels, pickles and other salty foods should be avoided. Restricting salt in your diet is almost always needed. Water pills (diuretics) are often used to remove the excess salt and water from your body via urine. These medicines prevent the kidney from reabsorbing sodium. This increases urine flow. °Diuretic treatment may also result in lowering of potassium levels in your body. Potassium supplements may be needed if you have to use diuretics daily. Daily weights can help you keep track of your progress in clearing your edema. You should call your caregiver for follow up care as recommended. °SEEK IMMEDIATE MEDICAL CARE IF:  °· You have increased swelling, pain, redness, or heat in your legs. °· You develop shortness of breath, especially when lying down. °· You develop chest or abdominal pain, weakness, or fainting. °· You have a fever. °Document Released: 04/01/2004 Document Revised: 05/17/2011 Document Reviewed: 03/12/2009 °Exit[employer]® Patient Information ©2015 [employer]. This information is not intended to replace advice given to you by your health care provider. Make sure you discuss any questions you have with your health care provider. ° °

## 2013-11-25 NOTE — ED Notes (Signed)
Pt reports bilateral leg swelling x 1-2 weeks but worse today. Pt reports after she is off her feet they will go down.  Pt reports job consist of sitting.

## 2016-08-29 ENCOUNTER — Emergency Department (HOSPITAL_BASED_OUTPATIENT_CLINIC_OR_DEPARTMENT_OTHER)
Admission: EM | Admit: 2016-08-29 | Discharge: 2016-08-29 | Disposition: A | Discharge status: Home / Self Care | Payer: BLUE CROSS/BLUE SHIELD | Attending: Emergency Medicine | Admitting: Emergency Medicine

## 2016-08-29 ENCOUNTER — Encounter (HOSPITAL_BASED_OUTPATIENT_CLINIC_OR_DEPARTMENT_OTHER): Payer: Self-pay | Admitting: Emergency Medicine

## 2016-08-29 DIAGNOSIS — Z79899 Other long term (current) drug therapy: Secondary | ICD-10-CM | POA: Insufficient documentation

## 2016-08-29 DIAGNOSIS — N390 Urinary tract infection, site not specified: Secondary | ICD-10-CM

## 2016-08-29 DIAGNOSIS — R252 Cramp and spasm: Secondary | ICD-10-CM | POA: Diagnosis present

## 2016-08-29 DIAGNOSIS — E860 Dehydration: Secondary | ICD-10-CM | POA: Insufficient documentation

## 2016-08-29 LAB — CBC WITH DIFFERENTIAL/PLATELET
BASOS PCT: 0 %
Basophils Absolute: 0 10*3/uL (ref 0.0–0.1)
EOS ABS: 0.1 10*3/uL (ref 0.0–0.7)
Eosinophils Relative: 1 %
HCT: 35.5 % — ABNORMAL LOW (ref 36.0–46.0)
Hemoglobin: 12.2 g/dL (ref 12.0–15.0)
LYMPHS ABS: 4 10*3/uL (ref 0.7–4.0)
Lymphocytes Relative: 31 %
MCH: 30.3 pg (ref 26.0–34.0)
MCHC: 34.4 g/dL (ref 30.0–36.0)
MCV: 88.3 fL (ref 78.0–100.0)
MONO ABS: 1.4 10*3/uL — AB (ref 0.1–1.0)
MONOS PCT: 11 %
Neutro Abs: 7.3 10*3/uL (ref 1.7–7.7)
Neutrophils Relative %: 57 %
Platelets: 324 10*3/uL (ref 150–400)
RBC: 4.02 MIL/uL (ref 3.87–5.11)
RDW: 12.1 % (ref 11.5–15.5)
WBC: 12.8 10*3/uL — ABNORMAL HIGH (ref 4.0–10.5)

## 2016-08-29 LAB — URINALYSIS, ROUTINE W REFLEX MICROSCOPIC
BILIRUBIN URINE: NEGATIVE
Glucose, UA: NEGATIVE mg/dL
KETONES UR: NEGATIVE mg/dL
NITRITE: POSITIVE — AB
PROTEIN: NEGATIVE mg/dL
SPECIFIC GRAVITY, URINE: 1.018 (ref 1.005–1.030)
pH: 6 (ref 5.0–8.0)

## 2016-08-29 LAB — COMPREHENSIVE METABOLIC PANEL
ALBUMIN: 4 g/dL (ref 3.5–5.0)
ALT: 21 U/L (ref 14–54)
ANION GAP: 9 (ref 5–15)
AST: 23 U/L (ref 15–41)
Alkaline Phosphatase: 71 U/L (ref 38–126)
BUN: 28 mg/dL — ABNORMAL HIGH (ref 6–20)
CALCIUM: 9.1 mg/dL (ref 8.9–10.3)
CO2: 22 mmol/L (ref 22–32)
Chloride: 103 mmol/L (ref 101–111)
Creatinine, Ser: 1.24 mg/dL — ABNORMAL HIGH (ref 0.44–1.00)
GFR calc non Af Amer: 47 mL/min — ABNORMAL LOW (ref >60)
GFR, EST AFRICAN AMERICAN: 54 mL/min — AB (ref >60)
GLUCOSE: 110 mg/dL — AB (ref 65–99)
POTASSIUM: 4.6 mmol/L (ref 3.5–5.1)
Sodium: 134 mmol/L — ABNORMAL LOW (ref 135–145)
Total Bilirubin: 0.7 mg/dL (ref 0.3–1.2)
Total Protein: 7.3 g/dL (ref 6.5–8.1)

## 2016-08-29 LAB — URINALYSIS, MICROSCOPIC (REFLEX)

## 2016-08-29 LAB — CK: CK TOTAL: 148 U/L (ref 38–234)

## 2016-08-29 LAB — MAGNESIUM: MAGNESIUM: 2 mg/dL (ref 1.7–2.4)

## 2016-08-29 MED ORDER — SODIUM CHLORIDE 0.9 % IV BOLUS (SEPSIS)
1000.0000 mL | Freq: Once | INTRAVENOUS | Status: AC
  Administered 2016-08-29: 1000 mL via INTRAVENOUS

## 2016-08-29 MED ORDER — CEPHALEXIN 500 MG PO CAPS: 500.0000 mg | ORAL_CAPSULE | Freq: Three times a day (TID) | ORAL | 0 refills | Status: DC

## 2016-08-29 NOTE — ED Notes (Signed)
Pt given d/c instructions as per chart. Rx x 1. Verbalizes understanding. No questions. 

## 2016-08-29 NOTE — ED Triage Notes (Signed)
PT presents with multiple complaints, pt reports legs cramps, mom pain and numbness and pain when she raises both arms above her head.

## 2016-08-29 NOTE — ED Notes (Signed)
Alert, NAD, calm, interactive, resps e/u, speaking in clear complete sentences, no dyspnea noted, skin W&D, VSS, c/o general mouth lip/tongue numbness (presently) and bilateral thigh cramping at night (resolved at present),  (denies: pain, sob, nausea, weakness, recent illness, dizziness or visual changes), EDP into room.

## 2016-08-29 NOTE — ED Provider Notes (Signed)
[facility] Provider Note   CSN: [id number] Arrival date & time: 08/29/16  1913  By signing my name below, I, [name], attest that this documentation has been prepared under the direction and in the presence of Loren Racer, MD. Electronically Signed: [name], Scribe. 08/29/16. 8:55 PM.  History   Chief Complaint No chief complaint on file.  HPI Comments: Tamara Klein is a 61 y.o. female presents to the Emergency Department complaining of mouth tingling and numbness that began yesterday. She also complains of bilateral lower extremity cramping. She denies any focal weakness. Patient has ongoing urinary urgency but denies dysuria, frequency or hematuria. Recently started taking appetite suppressant. Denies any visual or speech changes. No fever or chills.      The history is provided by the patient. No language interpreter was used.    History reviewed. No pertinent past medical history.  There are no active problems to display for this patient.   Past Surgical History:  Procedure Laterality Date  . ABDOMINAL HYSTERECTOMY    . bone spur removal bilateral heels    . BREAST LUMPECTOMY    . ECTOPIC PREGNANCY SURGERY    . left arthrocsopic knee surgery    . left bunionectomy    . ROTATOR CUFF REPAIR      OB History    No data available       Home Medications    Prior to Admission medications   Medication Sig Start Date End Date Taking? Authorizing Provider  b complex vitamins tablet Take 1 tablet by mouth every morning.    [provider]  cephALEXin (KEFLEX) 500 MG capsule Take 1 capsule (500 mg total) by mouth 3 (three) times daily. 08/29/16   Loren Racer, MD  hydrochlorothiazide (HYDRODIURIL) 25 MG tablet Take 1 tablet (25 mg total) by mouth daily. 11/25/13   Rolan Bucco, MD  Multiple Vitamin (MULTIVITAMIN WITH MINERALS) TABS Take 1 tablet by mouth every morning.    [provider]  naproxen (NAPROSYN) 500 MG  tablet Take 500 mg by mouth 2 (two) times daily as needed (for pain).     [provider]    Family History Family History  Problem Relation Age of Onset  . Cancer Mother   . Hypertension Other   . Coronary artery disease Other   . Diabetes Other     Social History Social History  Substance Use Topics  . Smoking status: Never Smoker  . Smokeless tobacco: Not on file  . Alcohol use No     Allergies   Patient has no known allergies.   Review of Systems Review of Systems  Constitutional: Negative for chills, fatigue and fever.  HENT: Negative for congestion, facial swelling, sinus pressure, sore throat and trouble swallowing.   Eyes: Negative for photophobia and visual disturbance.  Respiratory: Negative for cough and shortness of breath.   Cardiovascular: Negative for chest pain, palpitations and leg swelling.  Gastrointestinal: Negative for abdominal pain, diarrhea, nausea and vomiting.  Genitourinary: Negative for difficulty urinating, dysuria, flank pain and frequency.  Musculoskeletal: Positive for arthralgias and myalgias. Negative for back pain, joint swelling, neck pain and neck stiffness.  Skin: Negative for rash and wound.  Neurological: Positive for numbness. Negative for dizziness, seizures, syncope, weakness and headaches.  Psychiatric/Behavioral: Negative for confusion.  All other systems reviewed and are negative.    Physical Exam Updated Vital Signs BP (!) 104/93 (BP Location: Left Arm)   Pulse 88   Temp 98.4 F (  36.9 C) (Oral)   Resp 20   Ht 5\' 3"  (1.6 m)   Wt 89.8 kg (198 lb)   SpO2 100%   BMI 35.07 kg/m   Physical Exam  Constitutional: She is oriented to person, place, and time. She appears well-developed and well-nourished. No distress.  HENT:  Head: Normocephalic and atraumatic.  Mouth/Throat: Oropharynx is clear and moist.  No facial asymmetry. No numbness to light touch. Oropharynx is clear without swelling. Uvula is midline.    Eyes: EOM are normal. Pupils are equal, round, and reactive to light.  Neck: Normal range of motion. Neck supple.  No meningismus.  Cardiovascular: Normal rate and regular rhythm.  Exam reveals no gallop and no friction rub.   No murmur heard. Pulmonary/Chest: Effort normal and breath sounds normal. No respiratory distress. She has no wheezes. She has no rales. She exhibits no tenderness.  Abdominal: Soft. Bowel sounds are normal. There is no tenderness. There is no rebound and no guarding.  Musculoskeletal: Normal range of motion. She exhibits no edema or tenderness.  No lower extremity asymmetry or tenderness. Patient does have mild right knee effusion and pain with range of motion. No warmth or erythema. Distal pulses are 2+. No midline thoracic or lumbar tenderness. No CVA tenderness bilaterally.  Lymphadenopathy:    She has no cervical adenopathy.  Neurological: She is alert and oriented to person, place, and time.  Patient is alert and oriented x3 with clear, goal oriented speech. Patient has 5/5 motor in all extremities. Sensation is intact to light touch. Bilateral finger-to-nose is normal with no signs of dysmetria. Patient has a normal gait and walks without assistance.  Skin: Skin is warm and dry. Capillary refill takes less than 2 seconds. No rash noted. She is not diaphoretic. No erythema.  Psychiatric: She has a normal mood and affect. Her behavior is normal.  Nursing note and vitals reviewed.    ED Treatments / Results  DIAGNOSTIC STUDIES: Oxygen Saturation is 98% on RA, normal by my interpretation.    COORDINATION OF CARE: 8:55 PM Discussed treatment plan with pt at bedside and pt agreed to plan  Labs (all labs ordered are listed, but only abnormal results are displayed) Labs Reviewed  CBC WITH DIFFERENTIAL/PLATELET - Abnormal; Notable for the following:       Result Value   WBC 12.8 (*)    HCT 35.5 (*)    Monocytes Absolute 1.4 (*)    All other components within  normal limits  COMPREHENSIVE METABOLIC PANEL - Abnormal; Notable for the following:    Sodium 134 (*)    Glucose, Bld 110 (*)    BUN 28 (*)    Creatinine, Ser 1.24 (*)    GFR calc non Af Amer 47 (*)    GFR calc Af Amer 54 (*)    All other components within normal limits  URINALYSIS, ROUTINE W REFLEX MICROSCOPIC - Abnormal; Notable for the following:    APPearance CLOUDY (*)    Hgb urine dipstick TRACE (*)    Nitrite POSITIVE (*)    Leukocytes, UA MODERATE (*)    All other components within normal limits  URINALYSIS, MICROSCOPIC (REFLEX) - Abnormal; Notable for the following:    Bacteria, UA MANY (*)    Squamous Epithelial / LPF 0-5 (*)    All other components within normal limits  CK  MAGNESIUM    EKG  EKG Interpretation None       Radiology No results found.  Procedures Procedures (including  critical care time)  Medications Ordered in ED Medications  sodium chloride 0.9 % bolus 1,000 mL (1,000 mLs Intravenous New Bag/Given 08/29/16 2201)     Initial Impression / Assessment and Plan / ED Course  I have reviewed the triage vital signs and the nursing notes.  Pertinent labs & imaging results that were available during my care of the patient were reviewed by me and considered in my medical decision making (see chart for details).     Normal neurologic exam. Patient likely has some degree of dehydration responsible for elevation in her creatinine. Give IV fluids in the emergency department. There is evidence of urinary tract infection on UA. We'll start antibiotics. Patient's appetite suppressant may be contributing to her symptoms. Advised to follow-up with her primary physician about possible discontinuation of this medication. Return precautions given.  Final Clinical Impressions(s) / ED Diagnoses   Final diagnoses:  Muscle cramps  Dehydration  Urinary tract infection without hematuria, site unspecified    New Prescriptions New Prescriptions   CEPHALEXIN  (KEFLEX) 500 MG CAPSULE    Take 1 capsule (500 mg total) by mouth 3 (three) times daily.   I personally performed the services described in this documentation, which was scribed in my presence. The recorded information has been reviewed and is accurate.      Loren Racer, MD 08/29/16 [phone]

## 2017-05-21 ENCOUNTER — Emergency Department (HOSPITAL_BASED_OUTPATIENT_CLINIC_OR_DEPARTMENT_OTHER)
Admission: EM | Admit: 2017-05-21 | Discharge: 2017-05-21 | Disposition: A | Discharge status: Home / Self Care | Payer: BLUE CROSS/BLUE SHIELD | Attending: Emergency Medicine | Admitting: Emergency Medicine

## 2017-05-21 ENCOUNTER — Encounter (HOSPITAL_BASED_OUTPATIENT_CLINIC_OR_DEPARTMENT_OTHER): Payer: Self-pay | Admitting: Emergency Medicine

## 2017-05-21 ENCOUNTER — Other Ambulatory Visit: Payer: Self-pay

## 2017-05-21 DIAGNOSIS — I10 Essential (primary) hypertension: Secondary | ICD-10-CM | POA: Diagnosis not present

## 2017-05-21 DIAGNOSIS — K0889 Other specified disorders of teeth and supporting structures: Secondary | ICD-10-CM | POA: Insufficient documentation

## 2017-05-21 DIAGNOSIS — Z79899 Other long term (current) drug therapy: Secondary | ICD-10-CM | POA: Insufficient documentation

## 2017-05-21 HISTORY — DX: Essential (primary) hypertension: I10

## 2017-05-21 MED ORDER — AMOXICILLIN 500 MG PO CAPS: 500.0000 mg | ORAL_CAPSULE | Freq: Three times a day (TID) | ORAL | 0 refills | Status: AC

## 2017-05-21 NOTE — ED Provider Notes (Signed)
[facility] EMERGENCY DEPARTMENT Provider Note   CSN: [id number] Arrival date & time: 05/21/17  1850     History   Chief Complaint Chief Complaint  Patient presents with  . Dental Pain    HPI Tamara Klein is a 62 y.o. female presents today for evaluation of left upper molar pain.  She reports that she has a cavity in the area that is loose.  She was told she should have gotten it pulled a few months ago however never followed through.  She also has a tooth next to it that is chipped.  She reports that she has tried oral gel without significant relief at home.  She denies fevers, N/V/D.    HPI  Past Medical History:  Diagnosis Date  . Hypertension     There are no active problems to display for this patient.   Past Surgical History:  Procedure Laterality Date  . ABDOMINAL HYSTERECTOMY    . bone spur removal bilateral heels    . BREAST LUMPECTOMY    . ECTOPIC PREGNANCY SURGERY    . left arthrocsopic knee surgery    . left bunionectomy    . neck fusion    . ROTATOR CUFF REPAIR      OB History    No data available       Home Medications    Prior to Admission medications   Medication Sig Start Date End Date Taking? Authorizing Provider  lisinopril (PRINIVIL,ZESTRIL) 20 MG tablet Take 20 mg by mouth daily.   Yes [provider]  losartan (COZAAR) 50 MG tablet Take 50 mg by mouth daily.   Yes [provider]  meloxicam (MOBIC) 15 MG tablet Take 15 mg by mouth daily.   Yes [provider]  montelukast (SINGULAIR) 10 MG tablet Take 10 mg by mouth at bedtime.   Yes [provider]  oxybutynin (DITROPAN-XL) 5 MG 24 hr tablet Take 5 mg by mouth at bedtime.   Yes [provider]  topiramate (TOPAMAX) 100 MG tablet Take 100 mg by mouth 2 (two) times daily.   Yes [provider]  amoxicillin (AMOXIL) 500 MG capsule Take 1 capsule (500 mg total) by mouth 3 (three) times daily for 7 days. 05/21/17 05/28/17   Cristina GongHammond, Eulises Kijowski W, PA-C  b complex vitamins tablet Take 1 tablet by mouth every morning.    [provider]  cephALEXin (KEFLEX) 500 MG capsule Take 1 capsule (500 mg total) by mouth 3 (three) times daily. 08/29/16   Loren RacerYelverton, David, MD  hydrochlorothiazide (HYDRODIURIL) 25 MG tablet Take 1 tablet (25 mg total) by mouth daily. 11/25/13   Rolan BuccoBelfi, Melanie, MD  Multiple Vitamin (MULTIVITAMIN WITH MINERALS) TABS Take 1 tablet by mouth every morning.    [provider]  naproxen (NAPROSYN) 500 MG tablet Take 500 mg by mouth 2 (two) times daily as needed (for pain).     [provider]    Family History Family History  Problem Relation Age of Onset  . Cancer Mother   . Hypertension Other   . Coronary artery disease Other   . Diabetes Other     Social History Social History   Tobacco Use  . Smoking status: Never Smoker  . Smokeless tobacco: Never Used  Substance Use Topics  . Alcohol use: No  . Drug use: No     Allergies   Patient has no known allergies.   Review of Systems Review of Systems  Constitutional: Negative for chills  and fever.  HENT: Positive for dental problem and facial swelling. Negative for congestion, drooling, hearing loss, postnasal drip, rhinorrhea, sore throat and trouble swallowing.   Gastrointestinal: Negative for diarrhea, nausea and vomiting.     Physical Exam Updated Vital Signs BP (!) 157/89 (BP Location: Left Arm)   Pulse (!) 58   Temp 97.8 F (36.6 C) (Oral)   Resp 18   Ht 5\' 3"  (1.6 m)   Wt 86.2 kg (190 lb)   SpO2 99%   BMI 33.66 kg/m   Physical Exam  Constitutional: She appears well-developed and well-nourished.  HENT:  Head: Normocephalic and atraumatic.  Mouth/Throat: Oropharynx is clear and moist.  Very mild swelling to left sided face, does not cross under jaw or include eye.  Soft submental space, no elevation of floor of mouth.  Tooth number 15 is painful, tooth number 14 is broken.  There is no  localized swelling or fluid collection consistent with an abscess.    Neck: Normal range of motion. Neck supple. No JVD present.  Cardiovascular: Normal rate.  Lymphadenopathy:    She has no cervical adenopathy.  Neurological: She is alert.  Skin: Skin is warm and dry. She is not diaphoretic.  Nursing note and vitals reviewed.    ED Treatments / Results  Labs (all labs ordered are listed, but only abnormal results are displayed) Labs Reviewed - No data to display  EKG  EKG Interpretation None       Radiology No results found.  Procedures Procedures (including critical care time)  Medications Ordered in ED Medications - No data to display   Initial Impression / Assessment and Plan / ED Course  I have reviewed the triage vital signs and the nursing notes.  Pertinent labs & imaging results that were available during my care of the patient were reviewed by me and considered in my medical decision making (see chart for details).    Patient with toothache.  No gross abscess.  Exam unconcerning for Ludwig's angina or spread of infection.  Will treat with amoxicillin and OTC pain medicine.  Urged patient to follow-up with dentist.  Tdap is UTD.     Final Clinical Impressions(s) / ED Diagnoses   Final diagnoses:  Pain, dental    ED Discharge Orders        Ordered    amoxicillin (AMOXIL) 500 MG capsule  3 times daily     05/21/17 2125       Norman Clay 05/22/17 0005    Tilden Fossa, MD 05/23/17 1249

## 2017-05-21 NOTE — Discharge Instructions (Signed)
Please call the dentists in the area to find a dentist who will see you.  Your insurance should allow you to be seen by a private dentist.  Please seek additional medical care if you develop fevers over 100.4, nausea, vomiting, diarrhea, shortness of breath, neck swelling or have any other concerns.    Please take Ibuprofen (Advil, motrin) and Tylenol (acetaminophen) to relieve your pain.  You may take up to 600 MG (3 pills) of normal strength ibuprofen every 8 hours as needed.  In between doses of ibuprofen you make take tylenol, up to 1,000 mg (two extra strength pills).  Do not take more than 3,000 mg tylenol in a 24 hour period.  Please check all medication labels as many medications such as pain and cold medications may contain tylenol.  Do not drink alcohol while taking these medications.  Do not take other NSAID'S while taking ibuprofen (such as aleve, mobic/meloxicam, or naproxen).  Please take ibuprofen with food to decrease stomach upset.  You may have diarrhea from the antibiotics.  It is very important that you continue to take the antibiotics even if you get diarrhea unless a medical professional tells you that you may stop taking them.  If you stop too early the bacteria you are being treated for will become stronger and you may need different, more powerful antibiotics that have more side effects and worsening diarrhea.  Please stay well hydrated and consider probiotics as they may decrease the severity of your diarrhea.

## 2017-05-21 NOTE — ED Notes (Signed)
Pt given d/c instructions as per chart. Rx x 1. Verbalizes understanding. No questions. 

## 2017-05-21 NOTE — ED Triage Notes (Signed)
Pt c/o left upper toothache/abscess. Pt reports that her tooth is chipped.

## 2017-11-21 ENCOUNTER — Other Ambulatory Visit: Payer: Self-pay | Admitting: Specialist

## 2017-11-21 ENCOUNTER — Other Ambulatory Visit: Payer: Self-pay | Admitting: Nurse Practitioner

## 2017-11-21 DIAGNOSIS — Z1231 Encounter for screening mammogram for malignant neoplasm of breast: Secondary | ICD-10-CM

## 2017-11-30 ENCOUNTER — Encounter: Payer: Self-pay | Admitting: Gastroenterology

## 2017-12-20 ENCOUNTER — Ambulatory Visit
Admission: RE | Admit: 2017-12-20 | Discharge: 2017-12-20 | Disposition: A | Discharge status: Home / Self Care | Payer: BLUE CROSS/BLUE SHIELD | Source: Ambulatory Visit | Attending: Specialist | Admitting: Specialist

## 2017-12-20 DIAGNOSIS — Z1231 Encounter for screening mammogram for malignant neoplasm of breast: Secondary | ICD-10-CM

## 2017-12-21 ENCOUNTER — Other Ambulatory Visit: Payer: Self-pay | Admitting: Specialist

## 2017-12-21 DIAGNOSIS — R921 Mammographic calcification found on diagnostic imaging of breast: Secondary | ICD-10-CM

## 2017-12-27 ENCOUNTER — Other Ambulatory Visit: Payer: Self-pay | Admitting: Specialist

## 2017-12-27 ENCOUNTER — Ambulatory Visit
Admission: RE | Admit: 2017-12-27 | Discharge: 2017-12-27 | Disposition: A | Discharge status: Home / Self Care | Payer: BLUE CROSS/BLUE SHIELD | Source: Ambulatory Visit | Attending: Specialist | Admitting: Specialist

## 2017-12-27 DIAGNOSIS — R921 Mammographic calcification found on diagnostic imaging of breast: Secondary | ICD-10-CM

## 2018-01-03 ENCOUNTER — Ambulatory Visit (AMBULATORY_SURGERY_CENTER): Payer: Self-pay | Admitting: *Deleted

## 2018-01-03 VITALS — Ht 63.0 in | Wt 208.0 lb

## 2018-01-03 DIAGNOSIS — Z1211 Encounter for screening for malignant neoplasm of colon: Secondary | ICD-10-CM

## 2018-01-03 NOTE — Progress Notes (Signed)
Patient denies any allergies to eggs or soy. Patient denies any problems with anesthesia/sedation. Post op nausea per pt. Patient denies any oxygen use at home. Patient denies taking any diet/weight loss medications or blood thinners.

## 2018-01-17 ENCOUNTER — Encounter: Payer: BLUE CROSS/BLUE SHIELD | Admitting: Gastroenterology

## 2019-11-26 ENCOUNTER — Other Ambulatory Visit: Payer: Self-pay | Admitting: Nurse Practitioner

## 2019-11-26 DIAGNOSIS — Z1231 Encounter for screening mammogram for malignant neoplasm of breast: Secondary | ICD-10-CM

## 2020-04-03 IMAGING — MG DIGITAL SCREENING BILATERAL MAMMOGRAM WITH TOMO AND CAD
8 series · 8 of 24 positions shown · non-contrast
Comparison: Previous exam(s).

CLINICAL DATA: Screening.

EXAM:
DIGITAL SCREENING BILATERAL MAMMOGRAM WITH TOMO AND CAD

[L MLO synth-2D]
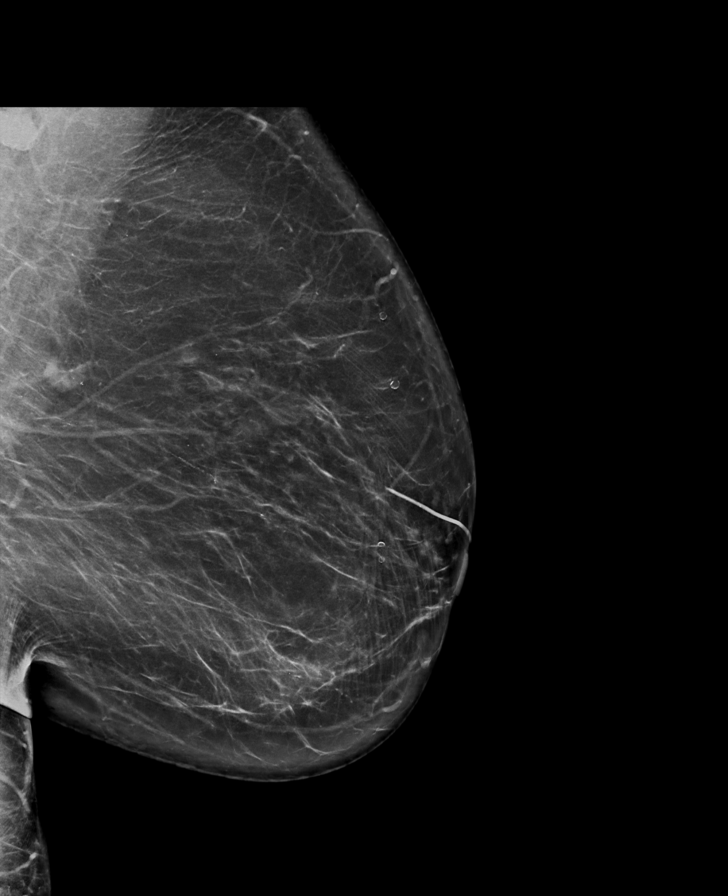

[R MLO synth-2D]
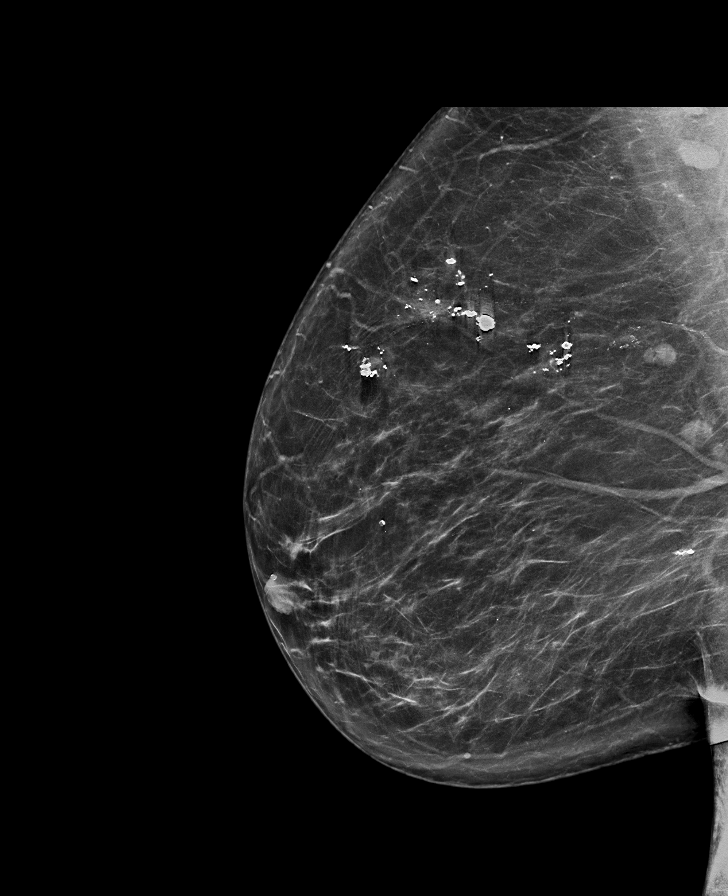

[L CC synth-2D]
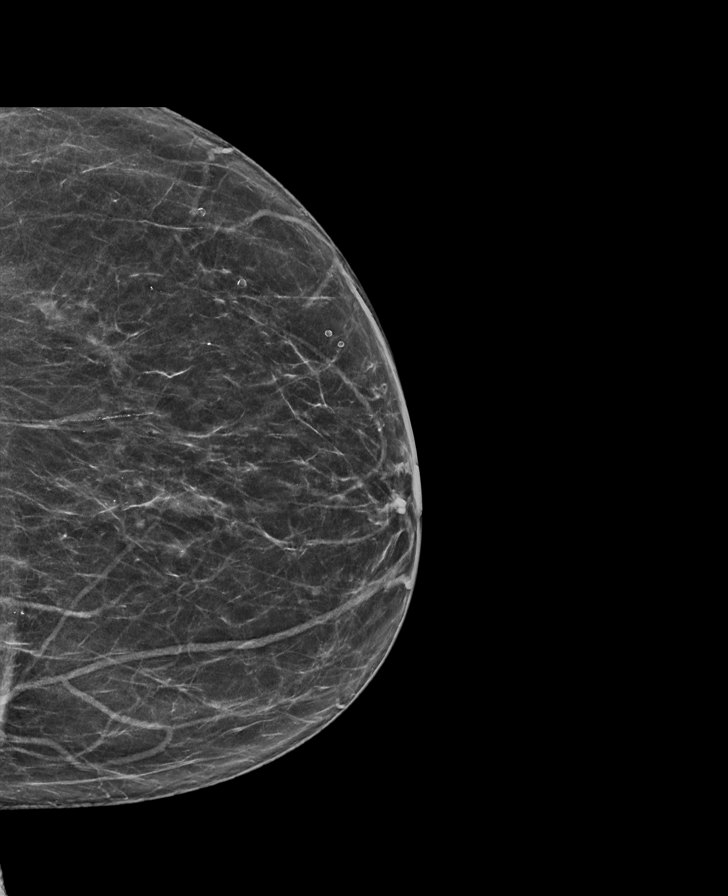

[R CC synth-2D]
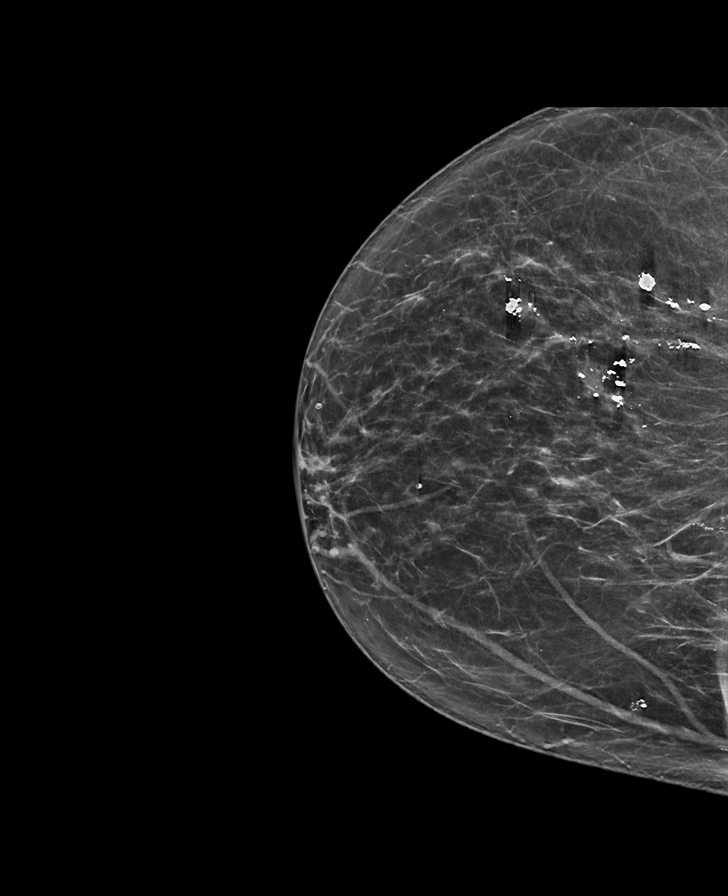

[L CC tomo · tomo slice 35/70.0]
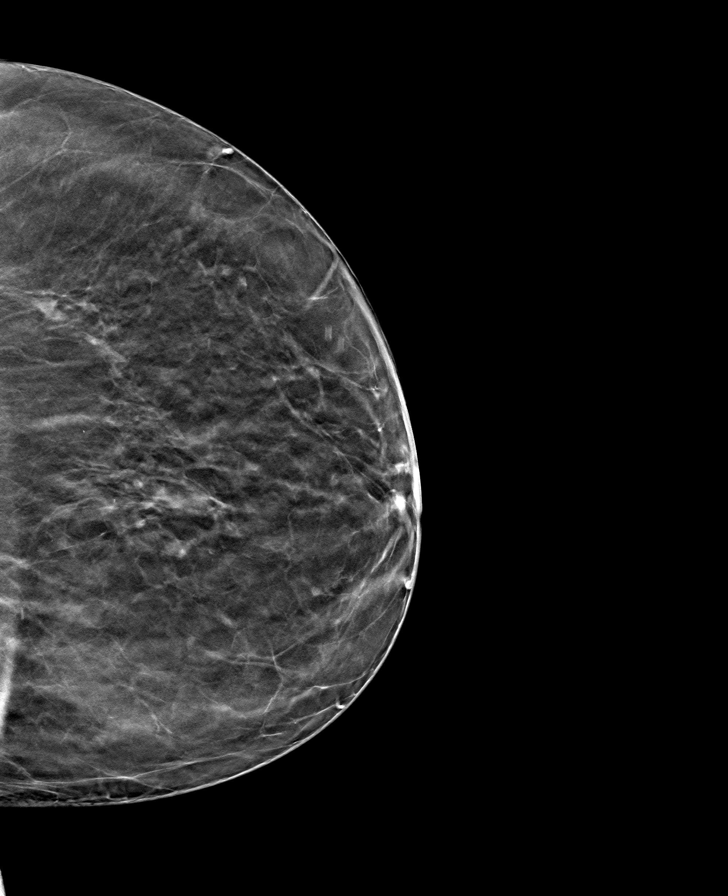

[L MLO tomo · tomo slice 47/92.0]
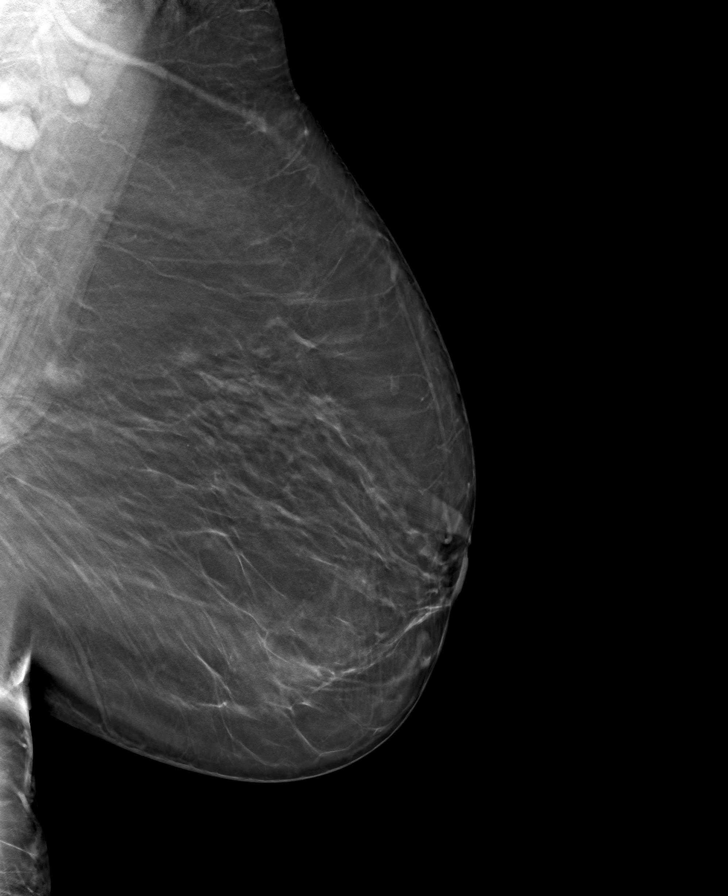

[R MLO tomo · tomo slice 43/85.0]
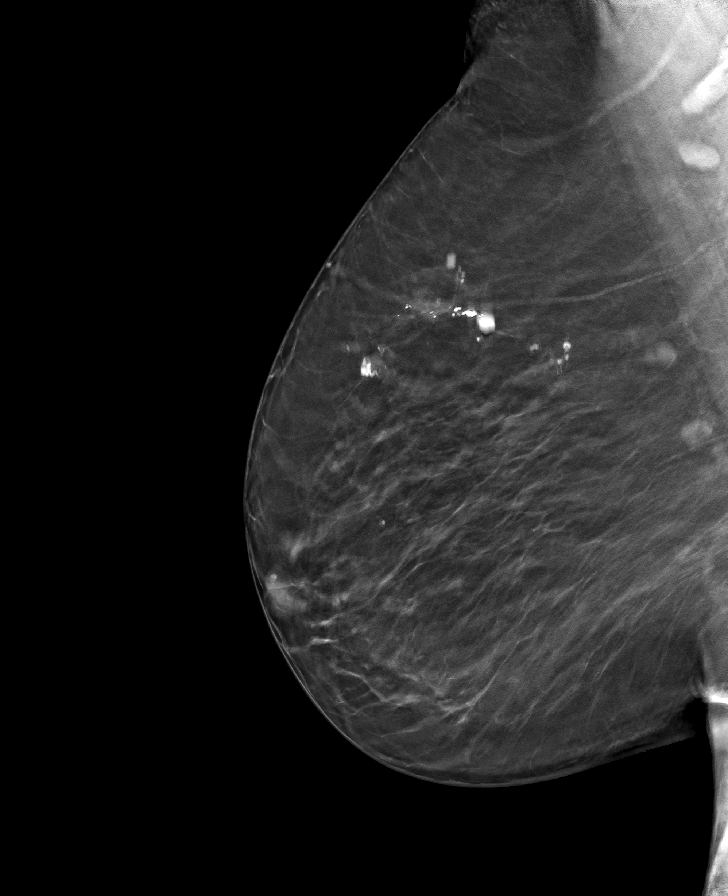

[R CC tomo · tomo slice 37/73.0]
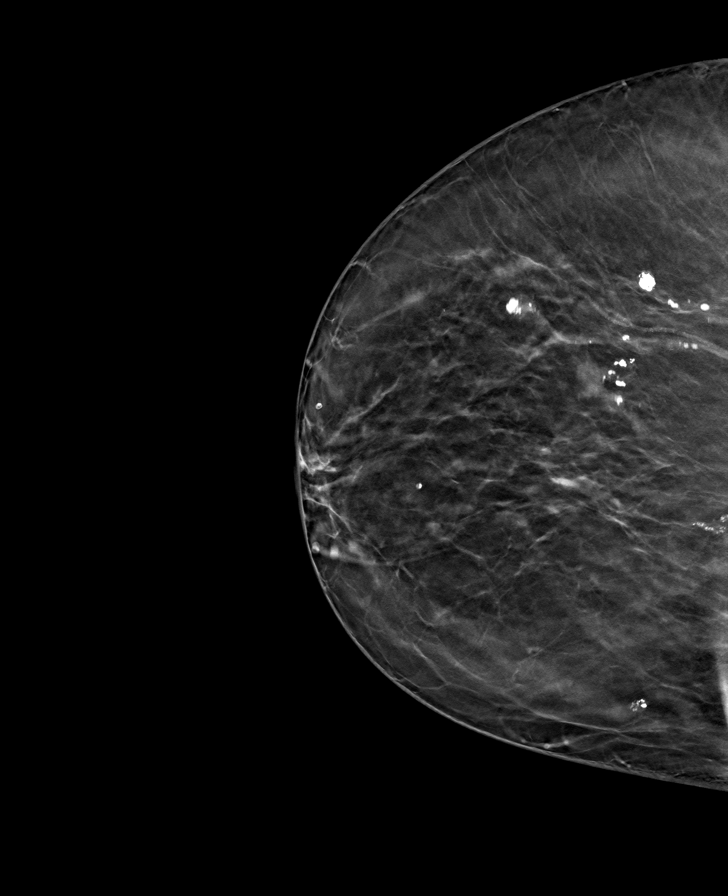

[8 of 24 positions shown; findings below may reference images not displayed]

ACR Breast Density Category b: There are scattered areas of
fibroglandular density.
FINDINGS: In the right breast, calcifications warrant further evaluation with
magnified views. In the left breast, no findings suspicious for
malignancy. Images were processed with CAD.
IMPRESSION: Further evaluation is suggested for calcifications in the right
breast.

RECOMMENDATION:
Diagnostic mammogram of the right breast. (Code:[id number]-[id number])

The patient will be contacted regarding the findings, and additional
imaging will be scheduled.

BI-RADS CATEGORY  0: Incomplete. Need additional imaging evaluation
and/or prior mammograms for comparison.

## 2020-04-10 IMAGING — MG DIGITAL DIAGNOSTIC UNILATERAL RIGHT MAMMOGRAM
2 series · 2 of 2 positions shown · non-contrast
Comparison: Previous exam(s).

CLINICAL DATA: Screening recall for right breast calcifications.

EXAM:
DIGITAL DIAGNOSTIC RIGHT MAMMOGRAM

[R ML]
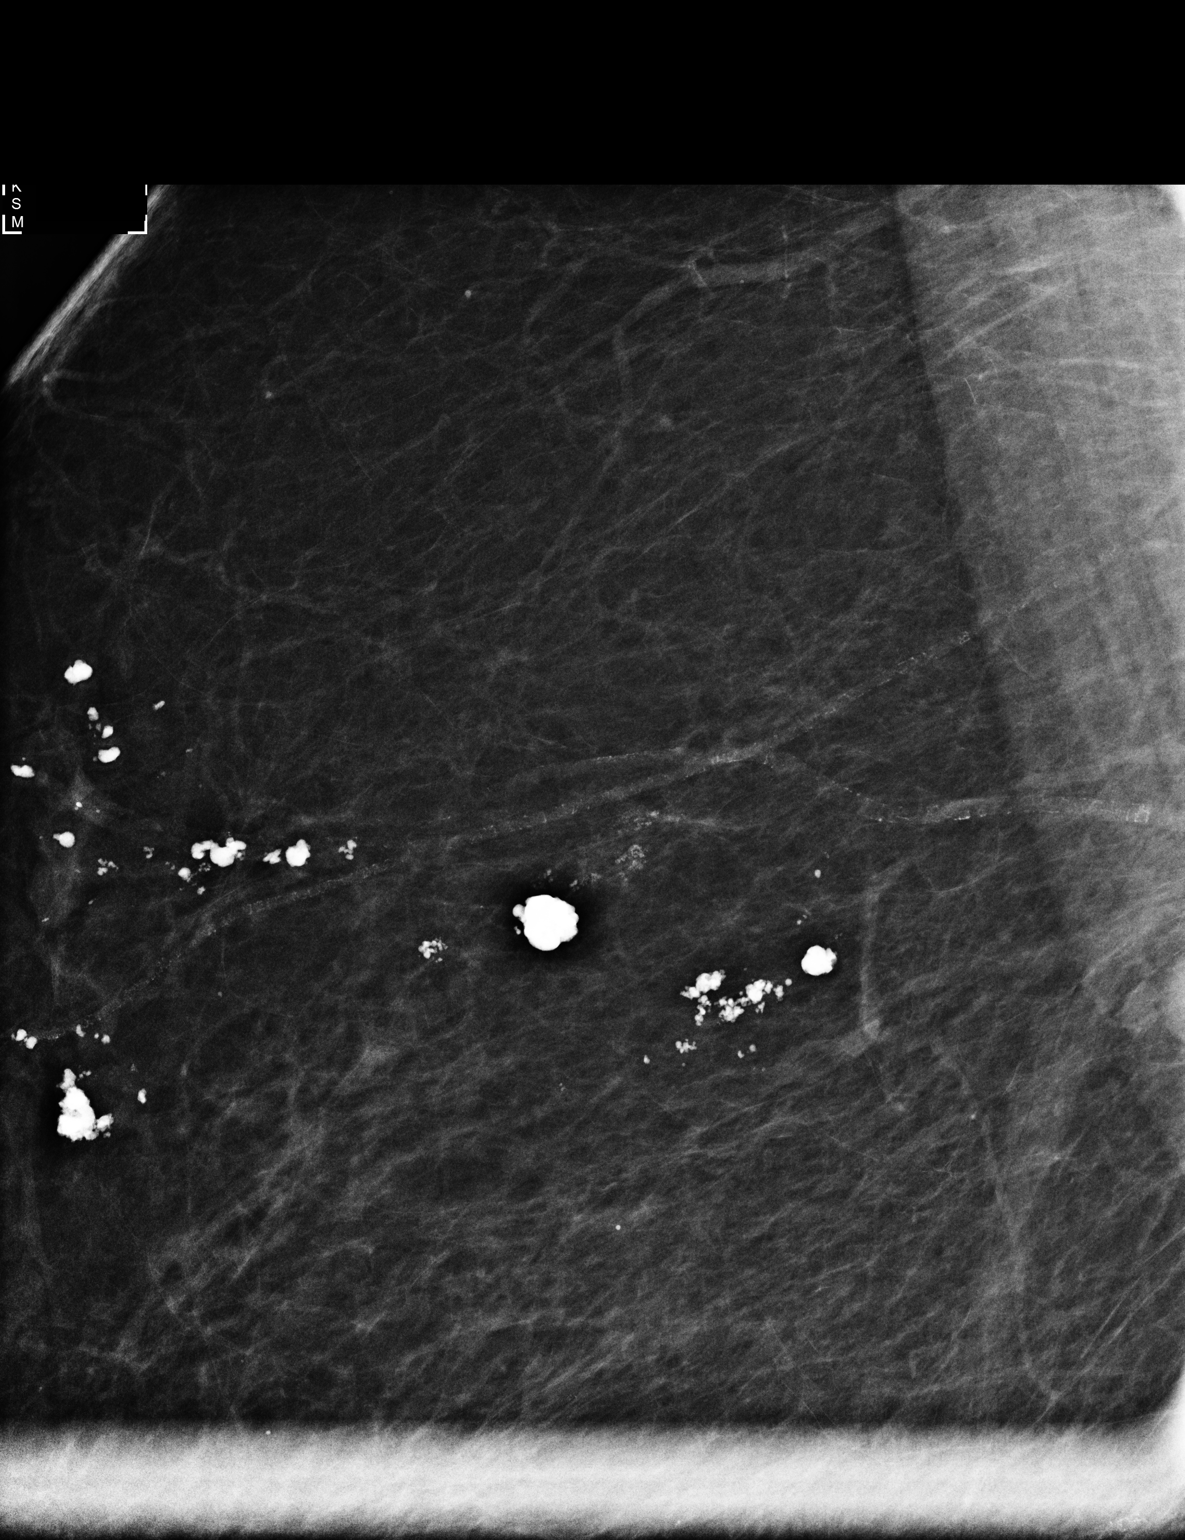

[R CC]
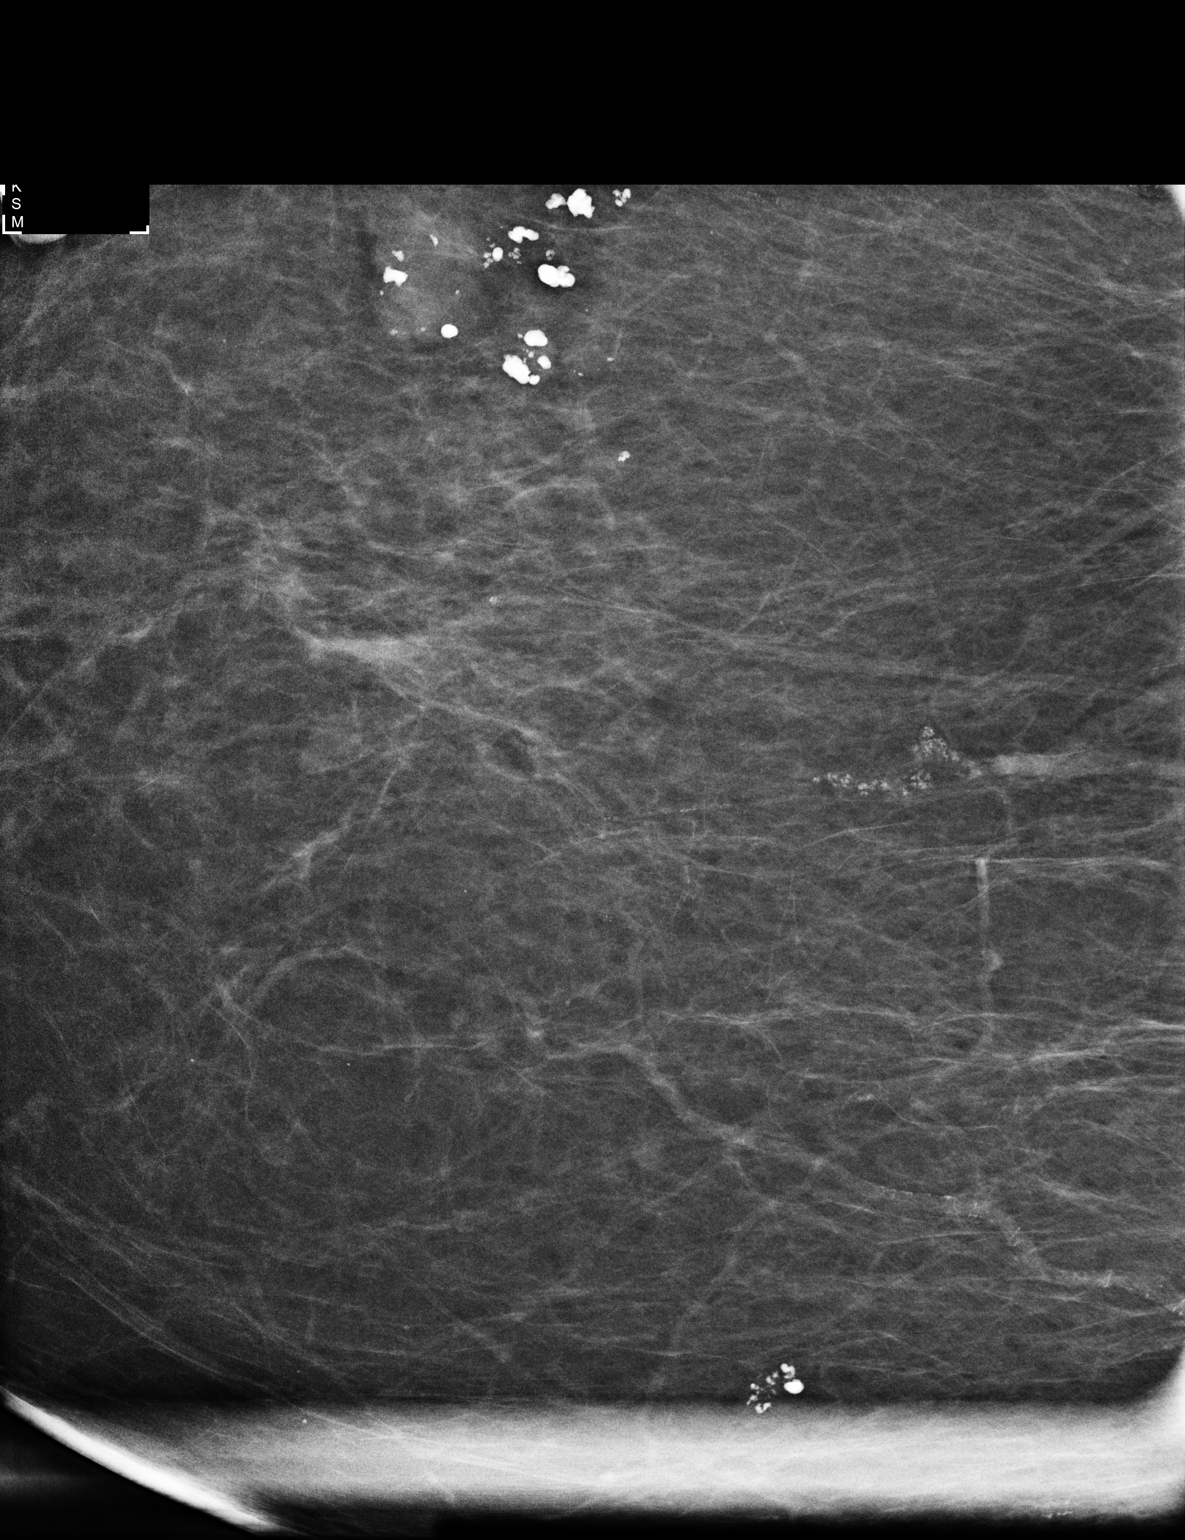

[2 of 2 positions shown; findings below may reference images not displayed]

ACR Breast Density Category b: There are scattered areas of
fibroglandular density.
FINDINGS: Spot compression magnification views were performed over the upper
inner right breast demonstrating a 1.4 cm group of coarse likely
early dystrophic calcifications. Additional clearly benign
dystrophic calcifications are identified in predominantly the
upper-outer right breast.
IMPRESSION: Probably benign right breast calcifications.

RECOMMENDATION:
Diagnostic mammography of the right breast with magnification views
in 6 months.

I have discussed the findings and recommendations with the patient.
Results were also provided in writing at the conclusion of the
visit. If applicable, a reminder letter will be sent to the patient
regarding the next appointment.

BI-RADS CATEGORY  3: Probably benign.

## 2022-04-07 ENCOUNTER — Emergency Department (HOSPITAL_BASED_OUTPATIENT_CLINIC_OR_DEPARTMENT_OTHER)
Admission: EM | Admit: 2022-04-07 | Discharge: 2022-04-07 | Disposition: A | Discharge status: Home / Self Care | Payer: 59 | Attending: Emergency Medicine | Admitting: Emergency Medicine

## 2022-04-07 ENCOUNTER — Other Ambulatory Visit: Payer: Self-pay

## 2022-04-07 ENCOUNTER — Emergency Department (HOSPITAL_BASED_OUTPATIENT_CLINIC_OR_DEPARTMENT_OTHER): Payer: 59

## 2022-04-07 ENCOUNTER — Encounter (HOSPITAL_BASED_OUTPATIENT_CLINIC_OR_DEPARTMENT_OTHER): Payer: Self-pay | Admitting: Emergency Medicine

## 2022-04-07 DIAGNOSIS — M25562 Pain in left knee: Secondary | ICD-10-CM | POA: Diagnosis present

## 2022-04-07 DIAGNOSIS — E119 Type 2 diabetes mellitus without complications: Secondary | ICD-10-CM | POA: Diagnosis not present

## 2022-04-07 DIAGNOSIS — I10 Essential (primary) hypertension: Secondary | ICD-10-CM | POA: Diagnosis not present

## 2022-04-07 HISTORY — DX: Type 2 diabetes mellitus without complications: E11.9

## 2022-04-07 MED ORDER — DOXYCYCLINE HYCLATE 100 MG PO TABS
100.0000 mg | ORAL_TABLET | Freq: Once | ORAL | Status: AC
  Administered 2022-04-07: 100 mg via ORAL
  Filled 2022-04-07: qty 1

## 2022-04-07 MED ORDER — DOXYCYCLINE HYCLATE 100 MG PO CAPS: 100.0000 mg | ORAL_CAPSULE | Freq: Two times a day (BID) | ORAL | 0 refills | Status: AC

## 2022-04-07 MED ORDER — LIDOCAINE-EPINEPHRINE (PF) 2 %-1:200000 IJ SOLN
10.0000 mL | Freq: Once | INTRAMUSCULAR | Status: AC
  Administered 2022-04-07: 10 mL
  Filled 2022-04-07: qty 20

## 2022-04-07 MED ORDER — KETOROLAC TROMETHAMINE 30 MG/ML IJ SOLN
30.0000 mg | Freq: Once | INTRAMUSCULAR | Status: AC
  Administered 2022-04-07: 30 mg via INTRAMUSCULAR
  Filled 2022-04-07: qty 1

## 2022-04-07 NOTE — ED Provider Notes (Signed)
Emergency Department Provider Note   I have reviewed the triage vital signs and the nursing notes.   HISTORY  Chief Complaint Knee Pain   HPI Tamara Klein is a 67 y.o. female with past history of hypertension, diabetes, followed by [facility] for knee pain presents to the emergency department for evaluation of acute onset swelling to the left knee.  She noticed symptoms 2 days ago.  She denies injury.  No pain with moving the joint.  No fevers or chills.  She noticed swelling to the inside of the left knee along with some redness.  No pain into the calf or thigh.  No history of similar in the past.  Past Medical History:  Diagnosis Date   Allergy    Diabetes mellitus without complication (HCC)    Hypertension    Post-operative nausea and vomiting     Review of Systems  Constitutional: No fever/chills Cardiovascular: Denies chest pain. Respiratory: Denies shortness of breath. Gastrointestinal: No abdominal pain.   Genitourinary: Negative for dysuria. Musculoskeletal: Positive left knee pain.  Skin: Negative for rash. Neurological: Negative for headaches.   ____________________________________________   PHYSICAL EXAM:  VITAL SIGNS: ED Triage Vitals  Enc Vitals Group     BP 04/07/22 1922 129/75     Pulse Rate 04/07/22 1922 71     Resp 04/07/22 1922 18     Temp 04/07/22 1922 98.2 F (36.8 C)     Temp src --      SpO2 04/07/22 1922 99 %     Weight 04/07/22 1922 195 lb (88.5 kg)     Height 04/07/22 1922 5\' 3"  (1.6 m)   Constitutional: Alert and oriented. Well appearing and in no acute distress. Eyes: Conjunctivae are normal.  Head: Atraumatic. Nose: No congestion/rhinnorhea. Mouth/Throat: Mucous membranes are moist.  Neck: No stridor.   Cardiovascular: Normal rate, regular rhythm. Good peripheral circulation. Grossly normal heart sounds.   Respiratory: Normal respiratory effort.  No retractions. Lungs CTAB. Gastrointestinal: Soft and  nontender. No distention.  Musculoskeletal: Normal range of motion of the left knee.  Focal swelling to the medial aspect of the left knee and distal thigh.  Mild surrounding induration. Neurologic:  Normal speech and language. No gross focal neurologic deficits are appreciated.  Skin:  Skin is warm and dry.  Left knee as above.  2 distinct, seemingly superficial areas of induration with some fluctuance to the medial left knee. No appreciable joint effusion laterally.   ____________________________________________   LABS (all labs ordered are listed, but only abnormal results are displayed)  Labs Reviewed  SYNOVIAL CELL COUNT + DIFF, W/ CRYSTALS - Abnormal; Notable for the following components:      Result Value   Color, Synovial PINK (*)    Appearance-Synovial CLOUDY (*)    All other components within normal limits  GLUCOSE, BODY FLUID OTHER           - Abnormal; Notable for the following components:   Glucose, Body Fluid Other INTVIS (*)    All other components within normal limits  GLUCOSE, BODY FLUID OTHER           - Abnormal; Notable for the following components:   Glucose, Body Fluid Other INTVIS (*)    All other components within normal limits  SYNOVIAL CELL COUNT + DIFF, W/ CRYSTALS - Abnormal; Notable for the following components:   Color, Synovial YELLOW (*)    Appearance-Synovial CLOUDY (*)    Neutrophil, Synovial 28 (*)  Lymphocytes-Synovial Fld 72 (*)    Monocyte-Macrophage-Synovial Fluid 0 (*)    All other components within normal limits  BODY FLUID CULTURE W GRAM STAIN  BODY FLUID CULTURE W GRAM STAIN   ____________________________________________   PROCEDURES  Procedure(s) performed:   .Joint Aspiration/Arthrocentesis  Date/Time: 04/07/2022 9:23 PM  Performed by: Margette Fast, MD Authorized by: Margette Fast, MD   Consent:    Consent obtained:  Verbal   Consent given by:  Patient   Risks, benefits, and alternatives were discussed: yes     Risks  discussed:  Bleeding, infection, pain, incomplete drainage and nerve damage   Alternatives discussed:  No treatment Universal protocol:    Patient identity confirmed:  Verbally with patient Location:    Location:  Knee   Knee:  L knee Anesthesia:    Anesthesia method:  Local infiltration   Local anesthetic:  Lidocaine 1% WITH epi Procedure details:    Preparation: Patient was prepped and draped in usual sterile fashion     Needle gauge:  18 G   Ultrasound guidance: yes     Approach:  Medial (superior)   Aspirate amount:  20   Aspirate characteristics:  Serous, yellow and blood-tinged   Steroid injected: no     Specimen collected: yes   Post-procedure details:    Dressing:  Adhesive bandage   Procedure completion:  Tolerated well, no immediate complications .Joint Aspiration/Arthrocentesis  Date/Time: 04/07/2022 9:24 PM  Performed by: Margette Fast, MD Authorized by: Margette Fast, MD   Consent:    Consent obtained:  Verbal   Consent given by:  Patient   Risks, benefits, and alternatives were discussed: yes     Risks discussed:  Bleeding, infection, pain and incomplete drainage   Alternatives discussed:  No treatment Universal protocol:    Patient identity confirmed:  Verbally with patient Location:    Location:  Knee   Knee:  L knee Anesthesia:    Anesthesia method:  Local infiltration   Local anesthetic:  Lidocaine 1% WITH epi Procedure details:    Preparation: Patient was prepped and draped in usual sterile fashion     Needle gauge:  18 G   Ultrasound guidance: yes     Approach:  Medial   Aspirate amount:  25 ml   Aspirate characteristics:  Serous and yellow   Steroid injected: no     Specimen collected: yes   Post-procedure details:    Dressing:  Adhesive bandage   Procedure completion:  Tolerated well, no immediate complications   ____________________________________________   INITIAL IMPRESSION / ASSESSMENT AND PLAN / ED COURSE  Pertinent labs &  imaging results that were available during my care of the patient were reviewed by me and considered in my medical decision making (see chart for details).   This patient is Presenting for Evaluation of knee pain, which does require a range of treatment options, and is a complaint that involves a high risk of morbidity and mortality.  The Differential Diagnoses include leg abscess, septic joint, cellulitis, etc.  Critical Interventions-    Medications  lidocaine-EPINEPHrine (XYLOCAINE W/EPI) 2 %-1:200000 (PF) injection 10 mL (10 mLs Infiltration Given by Other 04/07/22 2027)  ketorolac (TORADOL) 30 MG/ML injection 30 mg (30 mg Intramuscular Given 04/07/22 2024)  doxycycline (VIBRA-TABS) tablet 100 mg (100 mg Oral Given 04/07/22 2205)    Reassessment after intervention:  Pain improved.    Clinical Laboratory Tests: Sent fluid cultures from two needle aspirations.   Radiologic Tests  Ordered, included DVT US. I independently interpreted the images and agree with radiology interpretation.   Cardiac Monitor Tracing which shows NSR.   Medical Decision Making: Summary:  Patient presents to the emergency department for evaluation of knee pain.  On exam the area seems to be superficial and 2 focal areas of swelling to the medial knee.  The range of motion is preserved my suspicion for a septic joint is exceedingly low.  After bedside ultrasound, I undertook needle aspiration of 2 distinct areas and I did send this fluid for culture.  It was serous but also had some characteristics of synovial fluid there was no purulence.  The superior needle aspiration did have some blood-tinged fluid but no frank blood or hematoma.   Discussed with Dr. Lucia Gaskins with [facility].  We discussed the case and ultrasound findings.  Plan for antibiotics and patient can be seen in their office in the next 24 to 48 hours for reevaluation of the knee.  No findings at this time to suspect septic joint and will not have the  patient wait for her fluid results as the areas aspirated were the areas identified on [place] and not actually joint space fluid.   Reevaluation with update and discussion with patient. Plan for close ortho follow up. Will start abx.   Considered admission but developed outpatient mgmt plan with patient and ortho.   Patient's presentation is most consistent with acute presentation with potential threat to life or bodily function.   Disposition: discharge  ____________________________________________  FINAL CLINICAL IMPRESSION(S) / ED DIAGNOSES  Final diagnoses:  Acute pain of left knee     NEW OUTPATIENT MEDICATIONS STARTED DURING THIS VISIT:  Discharge Medication List as of 04/07/2022  9:50 PM     START taking these medications   Details  doxycycline (VIBRAMYCIN) 100 MG capsule Take 1 capsule (100 mg total) by mouth 2 (two) times daily for 7 days., Starting Wed 04/07/2022, Until Wed 04/14/2022, Normal        Note:  This document was prepared using Dragon voice recognition software and may include unintentional dictation errors.  Nanda Quinton, MD, [facility] Emergency Medicine    Daschel Roughton, [facility], MD 04/12/22 [phone]

## 2022-04-07 NOTE — Discharge Instructions (Addendum)
Please call your orthopedist for follow-up in the morning.  They will get you in to be seen in the next 24 to 48 hours.  Please take your antibiotics and you can continue compression to the knee area.  If you develop fever or other sudden/severe symptoms please return to the emergency department for reevaluation.

## 2022-04-07 NOTE — ED Triage Notes (Signed)
Pt with LT knee swelling and pain with "knots" x 1 wk; no injury; knee is reddened and warm to touch

## 2022-04-08 LAB — SYNOVIAL CELL COUNT + DIFF, W/ CRYSTALS
Crystals, Fluid: NONE SEEN
Crystals, Fluid: NONE SEEN
Eosinophils-Synovial: 0 % (ref 0–1)
Lymphocytes-Synovial Fld: 72 % — ABNORMAL HIGH (ref 0–20)
Monocyte-Macrophage-Synovial Fluid: 0 % — ABNORMAL LOW (ref 50–90)
Neutrophil, Synovial: 28 % — ABNORMAL HIGH (ref 0–25)
WBC, Synovial: 4 /mm3 (ref 0–200)
WBC, Synovial: 57 /mm3 (ref 0–200)

## 2022-04-10 LAB — GLUCOSE, BODY FLUID OTHER

## 2022-04-11 LAB — BODY FLUID CULTURE W GRAM STAIN
Culture: NO GROWTH
Culture: NO GROWTH
Gram Stain: NONE SEEN

## 2022-06-24 ENCOUNTER — Other Ambulatory Visit (HOSPITAL_COMMUNITY): Payer: Self-pay
